# Patient Record
Sex: Female | Born: 1990 | Race: White | Hispanic: No | Marital: Single | Smoking: Former smoker
Health system: Southern US, Community
[De-identification: ages and names within clinical notes are randomized; demographics above are authoritative.]

## PROBLEM LIST (undated history)

## (undated) DIAGNOSIS — F419 Anxiety disorder, unspecified: Secondary | ICD-10-CM

## (undated) DIAGNOSIS — B019 Varicella without complication: Secondary | ICD-10-CM

## (undated) DIAGNOSIS — F909 Attention-deficit hyperactivity disorder, unspecified type: Secondary | ICD-10-CM

## (undated) HISTORY — DX: Varicella without complication: B01.9

## (undated) HISTORY — DX: Anxiety disorder, unspecified: F41.9

## (undated) HISTORY — DX: Attention-deficit hyperactivity disorder, unspecified type: F90.9

---
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
## 2011-03-22 DIAGNOSIS — F419 Anxiety disorder, unspecified: Secondary | ICD-10-CM

## 2011-03-22 HISTORY — DX: Anxiety disorder, unspecified: F41.9

## 2012-05-02 ENCOUNTER — Ambulatory Visit: Payer: 59 | Admitting: Internal Medicine

## 2012-08-07 ENCOUNTER — Ambulatory Visit: Payer: 59 | Admitting: Internal Medicine

## 2012-10-02 ENCOUNTER — Ambulatory Visit: Payer: 59 | Admitting: Internal Medicine

## 2012-11-27 ENCOUNTER — Ambulatory Visit (INDEPENDENT_AMBULATORY_CARE_PROVIDER_SITE_OTHER): Payer: 59 | Admitting: Internal Medicine

## 2012-11-27 ENCOUNTER — Encounter: Payer: Self-pay | Admitting: Internal Medicine

## 2012-11-27 VITALS — BP 108/72 | HR 79 | Temp 98.7°F | Resp 14 | Ht 65.5 in | Wt 118.2 lb

## 2012-11-27 DIAGNOSIS — D7589 Other specified diseases of blood and blood-forming organs: Secondary | ICD-10-CM

## 2012-11-27 DIAGNOSIS — Z23 Encounter for immunization: Secondary | ICD-10-CM

## 2012-11-27 DIAGNOSIS — R5381 Other malaise: Secondary | ICD-10-CM

## 2012-11-27 DIAGNOSIS — F411 Generalized anxiety disorder: Secondary | ICD-10-CM

## 2012-11-27 DIAGNOSIS — E0590 Thyrotoxicosis, unspecified without thyrotoxic crisis or storm: Secondary | ICD-10-CM

## 2012-11-27 DIAGNOSIS — Z720 Tobacco use: Secondary | ICD-10-CM

## 2012-11-27 DIAGNOSIS — Z716 Tobacco abuse counseling: Secondary | ICD-10-CM

## 2012-11-27 DIAGNOSIS — F988 Other specified behavioral and emotional disorders with onset usually occurring in childhood and adolescence: Secondary | ICD-10-CM

## 2012-11-27 DIAGNOSIS — F17200 Nicotine dependence, unspecified, uncomplicated: Secondary | ICD-10-CM

## 2012-11-27 DIAGNOSIS — Z7189 Other specified counseling: Secondary | ICD-10-CM

## 2012-11-27 DIAGNOSIS — Z3009 Encounter for other general counseling and advice on contraception: Secondary | ICD-10-CM

## 2012-11-27 MED ORDER — PROPRANOLOL HCL 10 MG PO TABS: ORAL_TABLET | ORAL | Status: DC

## 2012-11-27 MED ORDER — AMPHETAMINE-DEXTROAMPHETAMINE 20 MG PO TABS: 20.0000 mg | ORAL_TABLET | Freq: Two times a day (BID) | ORAL | Status: DC

## 2012-11-27 MED ORDER — SERTRALINE HCL 50 MG PO TABS: 50.0000 mg | ORAL_TABLET | Freq: Every day | ORAL | Status: DC

## 2012-11-27 NOTE — Progress Notes (Signed)
Patient ID: Alyssa Sharp, female   DOB: 01-10-1991, 22 y.o.   MRN: [id number]     Patient Active Problem List   Diagnosis Date Noted  . GAD (generalized anxiety disorder) 11/28/2012  . ADD (attention deficit disorder) 11/28/2012  . Other malaise and fatigue 11/28/2012    Subjective:  CC:   Chief Complaint  Patient presents with  . Establish Care    HPI:   Alyssa Sharp is a 22 y.o. female who presents as a new patient to establish primary care with the chief complaint of Increased fatigue,  uncontrolled anxiety.  She is a rising Sr. at [employer], referred by mother Alyssa Sharp for care.  Hshe has a history of GAD diagnosed in  high school , previously treated with fluoxetine, which was resumed recently .  She self discontinued over the summer but resumed it  A week or so ago for persistent symptoms.  Anxiety often triggerred by health issues of parents and graqndparents., . 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  Does not jump from one realtionship to another  Has had 3 serious relationships,  No physical abuse,  Feels like there has been some emotional "garbage" that "freaked her out" and caused her to seek psychiatric cousnelling and psychotherapy,  Reluctant to divulge details,  Now independent,  Happy on her own.  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  Does not drink to excess,  No hard liquor,  Justan occasional beer.  Since she resumed the fluoxetine she has felt really tired. 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    Smokes cigarettes , initially to help her nerves,  Now just a habit,  Not exercising regularly.. Did cheerleading in  high school.  Psychiatrist would not switch her from fluoxetine, since she tolerated it in high school.  She spent the summer in [place] studying spanish.  Did no take the fluoxetine while away but used the adderall while away.   Compulsive worrier, about grandparents,  School,  Etcl,  Some compulsive checking   ADD diagnosed by psychiatry several years ago . Has been taking adderrall.     Past Medical History  Diagnosis Date  . Chicken pox   . Anxiety 2013    Past Surgical History  Procedure Laterality Date  . Laproscopic  2008    exploratory    Family History  Problem Relation Age of Onset  . Stroke Maternal Grandfather   . Hypertension Father   . Alcohol abuse Father     recovering   . Stroke Father     History   Social History  . Marital Status: Single    Spouse Name: N/A    Number of Children: N/A  . Years of Education: N/A   Occupational History  . Not on file.   Social History Main Topics  . Smoking status: Current Every Day Smoker -- 0.50 packs/day    Types: Cigarettes  . Smokeless tobacco: Never Used  . Alcohol Use: Yes  . Drug Use: No  . Sexual Activity: Yes   Other Topics Concern  .  Not on file   Social History Narrative  . No narrative on file       @ALLHX @    Review of Systems:   The remainder of the review of systems was negative except those addressed in the HPI.       Objective:  BP 108/72  Pulse 79  Temp(Src) 98.7 F (37.1 C) (Oral)  Resp 14  Ht 5' 5.5" (1.664 m)  Wt 118 lb 4 oz (53.638 kg)  BMI 19.37 kg/m2  SpO2 98%  General appearance: alert, cooperative and appears stated age Ears: normal TM's and external ear canals both ears Throat: lips, mucosa, and tongue normal; teeth and gums normal Neck: no adenopathy, no carotid bruit, supple, symmetrical, trachea midline and thyroid not enlarged, symmetric, no tenderness/mass/nodules Back: symmetric, no curvature. ROM normal. No CVA tenderness. Lungs: clear to  auscultation bilaterally Heart: regular rate and rhythm, S1, S2 normal, no murmur, click, rub or gallop Abdomen: soft, non-tender; bowel sounds normal; no masses,  no organomegaly Pulses: 2+ and symmetric Skin: Skin color, texture, turgor normal. No rashes or lesions Lymph nodes: Cervical, supraclavicular, and axillary nodes normal.  Assessment and Plan:  GAD (generalized anxiety disorder) Switching to sertraline since she is not tolerating fluoxetine and has some obsessive type worrying and behaviors.  recommended non pharmaclolig cways to manage anxiety including regular exercise and continued psychologic counselling.   ADD (attention deficit disorder) She has requested that I take over management and refills.  3 months given,  Drug contract signed   Other malaise and fatigue Ruling out anemia, thyroid, hepatic and renal  causes  Tobacco abuse counseling Smoking cessation instruction/counseling given:  counseled patient on the dangers of tobacco use, advised patient to stop smoking, and reviewed strategies to maximize success   Updated Medication List Outpatient Encounter Prescriptions as of 11/27/2012  Medication Sig Dispense Refill  . amphetamine-dextroamphetamine (ADDERALL) 20 MG tablet Take 1 tablet (20 mg total) by mouth 2 (two) times daily.  60 tablet  0  . amphetamine-dextroamphetamine (ADDERALL) 20 MG tablet Take 1 tablet (20 mg total) by mouth 2 (two) times daily.  60 tablet  0  . propranolol (INDERAL) 10 MG tablet 1/2 tablet 30 minutes before tests as needed  30 tablet  0  . sertraline (ZOLOFT) 50 MG tablet Take 1 tablet (50 mg total) by mouth daily.  30 tablet  1  . [DISCONTINUED] amphetamine-dextroamphetamine (ADDERALL) 20 MG tablet Take 40 mg by mouth 2 (two) times daily.      . [DISCONTINUED] amphetamine-dextroamphetamine (ADDERALL) 20 MG tablet Take 1 tablet (20 mg total) by mouth 2 (two) times daily.  60 tablet  0  . [DISCONTINUED] amphetamine-dextroamphetamine  (ADDERALL) 20 MG tablet Take 1 tablet (20 mg total) by mouth 2 (two) times daily.  60 tablet  0  . [DISCONTINUED] escitalopram (LEXAPRO) 10 MG tablet Take 10 mg by mouth 2 (two) times daily.      . [DISCONTINUED] propranolol (INDERAL) 10 MG tablet 1/2 tablet 30 minutes before tests as needed  30 tablet  0   No facility-administered encounter medications on file as of 11/27/2012.

## 2012-11-27 NOTE — Patient Instructions (Signed)
I am changing your SSRI from fluoxetine to sertraline (generic zoloft)  Take 1 fluoxetine daily for one week and add 1/2 sertraline for one week   Then stop the fluxoetine and increase the sertraline to full tablet in the evening after a full meal   E mail me in a few weeks  I am giving you a low dose beta blocker to take before your tests to help manage your "stage fright"  Try it first when you are at home to make sure it doesn't make you feel tired

## 2012-11-28 ENCOUNTER — Encounter: Payer: Self-pay | Admitting: Internal Medicine

## 2012-11-28 DIAGNOSIS — F988 Other specified behavioral and emotional disorders with onset usually occurring in childhood and adolescence: Secondary | ICD-10-CM | POA: Insufficient documentation

## 2012-11-28 DIAGNOSIS — Z3009 Encounter for other general counseling and advice on contraception: Secondary | ICD-10-CM | POA: Insufficient documentation

## 2012-11-28 DIAGNOSIS — F411 Generalized anxiety disorder: Secondary | ICD-10-CM | POA: Insufficient documentation

## 2012-11-28 DIAGNOSIS — R5381 Other malaise: Secondary | ICD-10-CM | POA: Insufficient documentation

## 2012-11-28 DIAGNOSIS — Z720 Tobacco use: Secondary | ICD-10-CM | POA: Insufficient documentation

## 2012-11-28 DIAGNOSIS — Z716 Tobacco abuse counseling: Secondary | ICD-10-CM | POA: Insufficient documentation

## 2012-11-28 LAB — COMPREHENSIVE METABOLIC PANEL
ALT: 13 U/L (ref 0–35)
AST: 18 U/L (ref 0–37)
Albumin: 4.7 g/dL (ref 3.5–5.2)
BUN: 9 mg/dL (ref 6–23)
Calcium: 9.4 mg/dL (ref 8.4–10.5)
Chloride: 103 mEq/L (ref 96–112)
Potassium: 4.2 mEq/L (ref 3.5–5.1)

## 2012-11-28 LAB — CBC WITH DIFFERENTIAL/PLATELET
Basophils Relative: 0.3 % (ref 0.0–3.0)
Eosinophils Absolute: 0.1 10*3/uL (ref 0.0–0.7)
Eosinophils Relative: 0.9 % (ref 0.0–5.0)
Lymphocytes Relative: 25.6 % (ref 12.0–46.0)
Neutrophils Relative %: 65.2 % (ref 43.0–77.0)
RBC: 4.16 Mil/uL (ref 3.87–5.11)
WBC: 6.4 10*3/uL (ref 4.5–10.5)

## 2012-11-28 NOTE — Assessment & Plan Note (Addendum)
Ruling out anemia, thyroid, hepatic and renal  Causes.  TSh was suppressed and mcv was > 100.  Additional studies needed

## 2012-11-28 NOTE — Assessment & Plan Note (Signed)
Smoking cessation instruction/counseling given:  counseled patient on the dangers of tobacco use, advised patient to stop smoking, and reviewed strategies to maximize success 

## 2012-11-28 NOTE — Assessment & Plan Note (Addendum)
Switching to sertraline since she is not tolerating fluoxetine and has some obsessive type worrying and behaviors.  recommended non pharmaclolig cways to manage anxiety including regular exercise and continued psychologic counselling.

## 2012-11-28 NOTE — Assessment & Plan Note (Signed)
She has requested that I take over management and refills.  3 months given,  Drug contract signed

## 2012-12-01 NOTE — Addendum Note (Signed)
Addended by: Sherlene Shams on: 12/01/2012 07:21 AM   Modules accepted: Orders

## 2012-12-28 ENCOUNTER — Encounter: Payer: Self-pay | Admitting: *Deleted

## 2013-03-06 ENCOUNTER — Encounter: Payer: Self-pay | Admitting: Internal Medicine

## 2013-03-06 DIAGNOSIS — F411 Generalized anxiety disorder: Secondary | ICD-10-CM

## 2013-03-08 ENCOUNTER — Telehealth: Payer: Self-pay | Admitting: Emergency Medicine

## 2013-03-08 MED ORDER — BUSPIRONE HCL 10 MG PO TABS: 10.0000 mg | ORAL_TABLET | Freq: Three times a day (TID) | ORAL | Status: DC

## 2013-03-08 NOTE — Assessment & Plan Note (Signed)
Her anxiety is not relieved with zoloft trial per MyChart message.  She is requesting "instant" medication, which I advised against given her age.  Trial of buspirone and one month follow up advised.  She needs the names of the psychologists that we have compiled

## 2013-03-08 NOTE — Telephone Encounter (Signed)
Patient has already started seeing a therapist named Dr. Delford Field at her school in [place]. She is aware of the medication, she will pick up meds on Sunday. Pt will come to our office on Jan 7th at 1:45 before heading back to school on 1/12.

## 2013-03-08 NOTE — Telephone Encounter (Signed)
Her anxiety is not relieved with zoloft trial per MyChart message.  She is requesting "instant" medication, which I advised against given her age.  Trial of buspirone and one month follow up advised.  She needs the names of the psychologists that we have compiled . Amber can you e mail her with those names ?

## 2013-03-27 ENCOUNTER — Encounter: Payer: Self-pay | Admitting: Internal Medicine

## 2013-03-27 ENCOUNTER — Ambulatory Visit (INDEPENDENT_AMBULATORY_CARE_PROVIDER_SITE_OTHER): Payer: 59 | Admitting: Internal Medicine

## 2013-03-27 VITALS — BP 112/64 | HR 81 | Temp 98.1°F | Resp 12 | Wt 123.2 lb

## 2013-03-27 DIAGNOSIS — F411 Generalized anxiety disorder: Secondary | ICD-10-CM

## 2013-03-27 DIAGNOSIS — Z7189 Other specified counseling: Secondary | ICD-10-CM

## 2013-03-27 DIAGNOSIS — F17200 Nicotine dependence, unspecified, uncomplicated: Secondary | ICD-10-CM

## 2013-03-27 DIAGNOSIS — Z716 Tobacco abuse counseling: Secondary | ICD-10-CM

## 2013-03-27 MED ORDER — AMPHETAMINE-DEXTROAMPHETAMINE 20 MG PO TABS: 20.0000 mg | ORAL_TABLET | Freq: Two times a day (BID) | ORAL | Status: DC

## 2013-03-27 MED ORDER — ALPRAZOLAM 0.5 MG PO TABS: 0.5000 mg | ORAL_TABLET | Freq: Every day | ORAL | Status: DC | PRN

## 2013-03-27 NOTE — Progress Notes (Signed)
Patient ID: Alyssa Sharp, female   DOB: 01/16/1991, 23 y.o.   MRN: [id number]   Patient Active Problem List   Diagnosis Date Noted  . GAD (generalized anxiety disorder) 11/28/2012  . ADD (attention deficit disorder) 11/28/2012  . Other malaise and fatigue 11/28/2012  . Tobacco abuse 11/28/2012  . Tobacco abuse counseling 11/28/2012  . Other general counseling and advice for contraceptive management 11/28/2012    Subjective:  CC:   Chief Complaint  Patient presents with  . Follow-up    medication change    HPI:   Alyssa GrewKelsei Robertsonis a 23 y.o. female who presents for follow up on change in  Medication from  Sertraline to buspirone for persistent daily anxiety.  Since switching to buspirone 10 mg twice daily she has noticed an improvement in her daily anxiety level and has had few episodes of panic.  No side effects noted.  She started with once daily, then increased to 2 daily at 6 hour intervals.  She Had several panic attakcs while taking zoloft that prompted an request for a change in medication.  Stressors include college exams and romantic conflicts .   She admits that she has been on Christmas break since making the medication change and is guardedly optimistic the the medication change will manage the transition,    She has been taking adderall and busirone and [place] in  [place] for the last month.     Past Medical History  Diagnosis Date  . Chicken pox   . Anxiety 2013    Past Surgical History  Procedure Laterality Date  . Laproscopic  2008    exploratory       The following portions of the patient's history were reviewed and updated as appropriate: Allergies, current medications, and problem list.    Review of Systems:   12 Pt  review of systems was negative except those addressed in the HPI,     History   Social History  . Marital Status: Single    Spouse Name: N/A    Number of Children: N/A  . Years of Education: N/A    Occupational History  . Not on file.   Social History Main Topics  . Smoking status: Current Every Day Smoker -- 0.50 packs/day    Types: Cigarettes  . Smokeless tobacco: Never Used  . Alcohol Use: Yes  . Drug Use: No  . Sexual Activity: Yes   Other Topics Concern  . Not on file   Social History Narrative  . No narrative on file    Objective:  Filed Vitals:   03/27/13 1338  BP: 112/64  Pulse: 81  Temp: 98.1 F (36.7 C)  Resp: 12     General appearance: alert, cooperative and appears stated age Ears: normal TM's and external ear canals both ears Throat: lips, mucosa, and tongue normal; teeth and gums normal Neck: no adenopathy, no carotid bruit, supple, symmetrical, trachea midline and thyroid not enlarged, symmetric, no tenderness/mass/nodules Back: symmetric, no curvature. ROM normal. No CVA tenderness. Lungs: clear to auscultation bilaterally Heart: regular rate and rhythm, S1, S2 normal, no murmur, click, rub or gallop Abdomen: soft, non-tender; bowel sounds normal; no masses,  no organomegaly Pulses: 2+ and symmetric Skin: Skin color, texture, turgor normal. No rashes or lesions Lymph nodes: Cervical, supraclavicular, and axillary nodes normal.  Assessment and Plan:  GAD (generalized anxiety disorder) Improved with change from sertraline to buspirone .  rx for alprazolam to use only for panic attacks. . Addictive  nature of medication discussed.   Tobacco abuse counseling Smoking cessation instruction/counseling given:  counseled patient on the dangers of tobacco use, advised patient to stop smoking, and reviewed strategies to maximize success  A total of 25 minutes was spent with patient more than half of which was spent in counseling, reviewing records from other providers and coordination of care.   Updated Medication List Outpatient Encounter Prescriptions as of 03/27/2013  Medication Sig  . amphetamine-dextroamphetamine (ADDERALL) 20 MG tablet Take 1  tablet (20 mg total) by mouth 2 (two) times daily.  Marland Kitchen amphetamine-dextroamphetamine (ADDERALL) 20 MG tablet Take 1 tablet (20 mg total) by mouth 2 (two) times daily.  . busPIRone (BUSPAR) 10 MG tablet Take 1 tablet (10 mg total) by mouth 3 (three) times daily.  . [DISCONTINUED] amphetamine-dextroamphetamine (ADDERALL) 20 MG tablet Take 1 tablet (20 mg total) by mouth 2 (two) times daily.  . [DISCONTINUED] amphetamine-dextroamphetamine (ADDERALL) 20 MG tablet Take 1 tablet (20 mg total) by mouth 2 (two) times daily.  . [DISCONTINUED] amphetamine-dextroamphetamine (ADDERALL) 20 MG tablet Take 1 tablet (20 mg total) by mouth 2 (two) times daily.  . [DISCONTINUED] amphetamine-dextroamphetamine (ADDERALL) 20 MG tablet Take 1 tablet (20 mg total) by mouth 2 (two) times daily.  . [DISCONTINUED] amphetamine-dextroamphetamine (ADDERALL) 20 MG tablet Take 1 tablet (20 mg total) by mouth 2 (two) times daily.  . [DISCONTINUED] amphetamine-dextroamphetamine (ADDERALL) 20 MG tablet Take 1 tablet (20 mg total) by mouth 2 (two) times daily.  Marland Kitchen ALPRAZolam (XANAX) 0.5 MG tablet Take 1 tablet (0.5 mg total) by mouth daily as needed for anxiety.  . [DISCONTINUED] propranolol (INDERAL) 10 MG tablet 1/2 tablet 30 minutes before tests as needed  . [DISCONTINUED] sertraline (ZOLOFT) 50 MG tablet Take 1 tablet (50 mg total) by mouth daily.     No orders of the defined types were placed in this encounter.    No Follow-up on file.

## 2013-03-27 NOTE — Progress Notes (Signed)
Pre-visit discussion using our clinic review tool. No additional management support is needed unless otherwise documented below in the visit note.  

## 2013-03-27 NOTE — Patient Instructions (Addendum)
Continue using the buspirone two to three times daily to manage your anxiety.  We can increase the dose if needed   Save the alprazolam for panic attacks   Smoking Cessation, Tips for Success If you are ready to quit smoking, congratulations! You have chosen to help yourself be healthier. Cigarettes bring nicotine, tar, carbon monoxide, and other irritants into your body. Your lungs, heart, and blood vessels will be able to work better without these poisons. There are many different ways to quit smoking. Nicotine gum, nicotine patches, a nicotine inhaler, or nicotine nasal spray can help with physical craving. Hypnosis, support groups, and medicines help break the habit of smoking. WHAT THINGS CAN I DO TO MAKE QUITTING EASIER?  Here are some tips to help you quit for good:  Pick a date when you will quit smoking completely. Tell all of your friends and family about your plan to quit on that date.  Do not try to slowly cut down on the number of cigarettes you are smoking. Pick a quit date and quit smoking completely starting on that day.  Throw away all cigarettes.   Clean and remove all ashtrays from your home, work, and car.   On a card, write down your reasons for quitting. Carry the card with you and read it when you get the urge to smoke.   Cleanse your body of nicotine. Drink enough water and fluids to keep your urine clear or pale yellow. Do this after quitting to flush the nicotine from your body.   Learn to predict your moods. Do not let a bad situation be your excuse to have a cigarette. Some situations in your life might tempt you into wanting a cigarette.   Never have "just one" cigarette. It leads to wanting another and another. Remind yourself of your decision to quit.   Change habits associated with smoking. If you smoked while driving or when feeling stressed, try other activities to replace smoking. Stand up when drinking your coffee. Brush your teeth after eating. Sit  in a different chair when you read the paper. Avoid alcohol while trying to quit, and try to drink fewer caffeinated beverages. Alcohol and caffeine may urge you to smoke.   Avoid foods and drinks that can trigger a desire to smoke, such as sugary or spicy foods and alcohol.   Ask people who smoke not to smoke around you.   Have something planned to do right after eating or having a cup of coffee. For example, plan to take a walk or exercise.   Try a relaxation exercise to calm you down and decrease your stress. Remember, you may be tense and nervous for the first 2 weeks after you quit, but this will pass.   Find new activities to keep your hands busy. Play with a pen, coin, or rubber band. Doodle or draw things on paper.   Brush your teeth right after eating. This will help cut down on the craving for the taste of tobacco after meals. You can also try mouthwash.   Use oral substitutes in place of cigarettes. Try using lemon drops, carrots, cinnamon sticks, or chewing gum. Keep them handy so they are available when you have the urge to smoke.   When you have the urge to smoke, try deep breathing.   Designate your home as a nonsmoking area.   If you are a heavy smoker, ask your health care provider about a prescription for nicotine chewing gum. It can ease your  withdrawal from nicotine.   Reward yourself. Set aside the cigarette money you save and buy yourself something nice.   Look for support from others. Join a support group or smoking cessation program. Ask someone at home or at work to help you with your plan to quit smoking.   Always ask yourself, "Do I need this cigarette or is this just a reflex?" Tell yourself, "Today, I choose not to smoke," or "I do not want to smoke." You are reminding yourself of your decision to quit.  Do not replace cigarette smoking with electronic cigarettes (commonly called e-cigarettes). The safety of e-cigarettes is unknown, and some may  contain harmful chemicals.  If you relapse, do not give up! Plan ahead and think about what you will do the next time you get the urge to smoke.  HOW WILL I FEEL WHEN I QUIT SMOKING? You may have symptoms of withdrawal because your body is used to nicotine (the addictive substance in cigarettes). You may crave cigarettes, be irritable, feel very hungry, cough often, get headaches, or have difficulty concentrating. The withdrawal symptoms are only temporary. They are strongest when you first quit but will go away within 10 14 days. When withdrawal symptoms occur, stay in control. Think about your reasons for quitting. Remind yourself that these are signs that your body is healing and getting used to being without cigarettes. Remember that withdrawal symptoms are easier to treat than the major diseases that smoking can cause.  Even after the withdrawal is over, expect periodic urges to smoke. However, these cravings are generally short lived and will go away whether you smoke or not. Do not smoke!  WHAT RESOURCES ARE AVAILABLE TO HELP ME QUIT SMOKING? Your health care provider can direct you to community resources or hospitals for support, which may include:  Group support.  Education.  Hypnosis.  Therapy. Document Released: 12/04/2003 Document Revised: 12/26/2012 Document Reviewed: 08/23/2012 [facility] Patient Information 2014 [place].

## 2013-03-28 ENCOUNTER — Telehealth: Payer: Self-pay | Admitting: Internal Medicine

## 2013-03-28 NOTE — Telephone Encounter (Signed)
Relevant patient education assigned to patient using Emmi. ° °

## 2013-03-30 ENCOUNTER — Encounter: Payer: Self-pay | Admitting: Internal Medicine

## 2013-03-30 NOTE — Assessment & Plan Note (Signed)
Improved with change from sertraline to buspirone .  rx for alprazolam to use only for panic attacks. . Addictive nature of medication discussed.

## 2013-03-30 NOTE — Assessment & Plan Note (Signed)
Smoking cessation instruction/counseling given:  counseled patient on the dangers of tobacco use, advised patient to stop smoking, and reviewed strategies to maximize success 

## 2013-05-10 ENCOUNTER — Other Ambulatory Visit: Payer: Self-pay | Admitting: *Deleted

## 2013-05-10 DIAGNOSIS — F411 Generalized anxiety disorder: Secondary | ICD-10-CM

## 2013-05-10 MED ORDER — BUSPIRONE HCL 10 MG PO TABS: 10.0000 mg | ORAL_TABLET | Freq: Three times a day (TID) | ORAL | Status: DC

## 2013-10-24 ENCOUNTER — Telehealth: Payer: Self-pay | Admitting: *Deleted

## 2013-10-24 MED ORDER — ALPRAZOLAM 0.5 MG PO TABS: 0.5000 mg | ORAL_TABLET | Freq: Every day | ORAL | Status: DC | PRN

## 2013-10-24 NOTE — Telephone Encounter (Signed)
Faxed Rx to pharmacy. Sent mychart on need for appointment

## 2013-10-24 NOTE — Telephone Encounter (Signed)
Fax from pharmacy requesting Alprazolam 0.5mg , last OV 1.7.15.  Please advise refill.

## 2013-10-24 NOTE — Telephone Encounter (Signed)
Refill one 30 days only.  Has not been seen in over 6 months so needs office visit prior to any more refills 

## 2013-11-01 ENCOUNTER — Encounter: Payer: Self-pay | Admitting: Adult Health

## 2013-11-01 ENCOUNTER — Ambulatory Visit (INDEPENDENT_AMBULATORY_CARE_PROVIDER_SITE_OTHER): Payer: 59 | Admitting: Adult Health

## 2013-11-01 VITALS — BP 115/77 | HR 100 | Temp 98.8°F | Resp 14 | Ht 65.5 in | Wt 123.5 lb

## 2013-11-01 DIAGNOSIS — H9193 Unspecified hearing loss, bilateral: Secondary | ICD-10-CM

## 2013-11-01 DIAGNOSIS — H9190 Unspecified hearing loss, unspecified ear: Secondary | ICD-10-CM

## 2013-11-01 NOTE — Patient Instructions (Signed)
  I am referring you to ENT for your decreased hearing.  Lower the volume on your music :-)  We will contact you with an appointment.

## 2013-11-01 NOTE — Progress Notes (Signed)
Pre visit review using our clinic review tool, if applicable. No additional management support is needed unless otherwise documented below in the visit note. 

## 2013-11-01 NOTE — Progress Notes (Signed)
Patient ID: Alyssa MansKelsei Sharp, female   DOB: [birth year], 23 y.o.   MRN: [id number]   Subjective:    Patient ID: Alyssa Sharp, female    DOB: [birth year], 23 y.o.   MRN: [id number]  HPI  Pt is a pleasant 23 y/o female who presents to clinic with concerns of hearing loss. She reports that she has had cerumen build up in the past and having them cleaned out alleviated her symptoms. She does not have any other symptoms. No recent URI. Pt listens to music at a high volume.  Past Medical History  Diagnosis Date  . Chicken pox   . Anxiety 2013    Current Outpatient Prescriptions on File Prior to Visit  Medication Sig Dispense Refill  . ALPRAZolam (XANAX) 0.5 MG tablet Take 1 tablet (0.5 mg total) by mouth daily as needed for anxiety.  30 tablet  0  . amphetamine-dextroamphetamine (ADDERALL) 20 MG tablet Take 1 tablet (20 mg total) by mouth 2 (two) times daily.  60 tablet  0   No current facility-administered medications on file prior to visit.     Review of Systems  Constitutional: Negative for fever.  HENT: Positive for hearing loss. Negative for congestion, ear discharge, ear pain, postnasal drip and sinus pressure.   Neurological: Negative for dizziness and speech difficulty.       Objective:  BP 115/77  Pulse 100  Temp(Src) 98.8 F (37.1 C) (Oral)  Resp 14  Ht 5' 5.5" (1.664 m)  Wt 123 lb 8 oz (56.019 kg)  BMI 20.23 kg/m2  SpO2 96%   Physical Exam  Constitutional: She is oriented to person, place, and time. No distress.  HENT:  Right Ear: External ear normal.  Left Ear: External ear normal.  TM are gray, translucent. No cerumen build up on either ear.  Cardiovascular: Normal rate and regular rhythm.   Pulmonary/Chest: Effort normal. No respiratory distress.  Musculoskeletal: Normal range of motion.  Neurological: She is alert and oriented to person, place, and time.  Skin: Skin is warm and dry.  Psychiatric: She has a normal mood and affect. Her behavior is normal.  Judgment and thought content normal.      Assessment & Plan:   1. Hearing loss, bilateral No cerumen buildup, no respiratory infection or congestion, TM are intact. I suspect that she may have hearing loss from listening to loud music. I am referring her to ENT for further evaluation of her hearing loss. - Ambulatory referral to ENT

## 2013-11-04 ENCOUNTER — Telehealth: Payer: Self-pay | Admitting: Internal Medicine

## 2013-11-04 NOTE — Telephone Encounter (Signed)
Relevant patient education mailed to patient.  

## 2013-12-13 ENCOUNTER — Ambulatory Visit (INDEPENDENT_AMBULATORY_CARE_PROVIDER_SITE_OTHER): Payer: 59 | Admitting: Internal Medicine

## 2013-12-13 DIAGNOSIS — Z0000 Encounter for general adult medical examination without abnormal findings: Secondary | ICD-10-CM

## 2013-12-15 DIAGNOSIS — Z0000 Encounter for general adult medical examination without abnormal findings: Secondary | ICD-10-CM | POA: Insufficient documentation

## 2013-12-15 NOTE — Progress Notes (Signed)
Patient ID: Alyssa Sharp, female   DOB: 03/27/1990, 23 y.o.   MRN: [id number] Patient no showed for exam.

## 2013-12-15 NOTE — Assessment & Plan Note (Signed)
Patient no showed for annual exam.

## 2014-01-13 ENCOUNTER — Telehealth: Payer: Self-pay | Admitting: Internal Medicine

## 2014-01-13 NOTE — Telephone Encounter (Signed)
ALPRAZolam (XANAX) 0.5 MG tablet  amphetamine-dextroamphetamine (ADDERALL) 20 MG tablet

## 2014-01-13 NOTE — Telephone Encounter (Signed)
Denied,  She no showed for her annual physical,  And did not return for the follow up labs that were ordered in 2014!!! Must be seen

## 2014-01-13 NOTE — Telephone Encounter (Signed)
Please advised refills

## 2014-01-13 NOTE — Telephone Encounter (Signed)
Notified of MDs message

## 2014-02-03 ENCOUNTER — Ambulatory Visit (INDEPENDENT_AMBULATORY_CARE_PROVIDER_SITE_OTHER): Payer: 59 | Admitting: Internal Medicine

## 2014-02-03 ENCOUNTER — Encounter: Payer: Self-pay | Admitting: Internal Medicine

## 2014-02-03 VITALS — BP 124/72 | HR 96 | Temp 98.6°F | Resp 16 | Ht 65.5 in | Wt 140.0 lb

## 2014-02-03 DIAGNOSIS — Z23 Encounter for immunization: Secondary | ICD-10-CM

## 2014-02-03 DIAGNOSIS — Z113 Encounter for screening for infections with a predominantly sexual mode of transmission: Secondary | ICD-10-CM

## 2014-02-03 DIAGNOSIS — F909 Attention-deficit hyperactivity disorder, unspecified type: Secondary | ICD-10-CM

## 2014-02-03 DIAGNOSIS — F329 Major depressive disorder, single episode, unspecified: Secondary | ICD-10-CM

## 2014-02-03 DIAGNOSIS — F411 Generalized anxiety disorder: Secondary | ICD-10-CM

## 2014-02-03 DIAGNOSIS — F988 Other specified behavioral and emotional disorders with onset usually occurring in childhood and adolescence: Secondary | ICD-10-CM

## 2014-02-03 DIAGNOSIS — R5383 Other fatigue: Secondary | ICD-10-CM

## 2014-02-03 LAB — CBC WITH DIFFERENTIAL/PLATELET
Basophils Absolute: 0 10*3/uL (ref 0.0–0.1)
Basophils Relative: 0.3 % (ref 0.0–3.0)
EOS ABS: 0.1 10*3/uL (ref 0.0–0.7)
Eosinophils Relative: 1.6 % (ref 0.0–5.0)
HCT: 44.5 % (ref 36.0–46.0)
Hemoglobin: 14.6 g/dL (ref 12.0–15.0)
LYMPHS PCT: 32 % (ref 12.0–46.0)
Lymphs Abs: 2.4 10*3/uL (ref 0.7–4.0)
MCHC: 32.9 g/dL (ref 30.0–36.0)
MCV: 100.2 fl — AB (ref 78.0–100.0)
MONO ABS: 0.7 10*3/uL (ref 0.1–1.0)
Monocytes Relative: 8.7 % (ref 3.0–12.0)
Neutro Abs: 4.3 10*3/uL (ref 1.4–7.7)
Neutrophils Relative %: 57.4 % (ref 43.0–77.0)
PLATELETS: 296 10*3/uL (ref 150.0–400.0)
RBC: 4.44 Mil/uL (ref 3.87–5.11)
RDW: 12.5 % (ref 11.5–15.5)
WBC: 7.5 10*3/uL (ref 4.0–10.5)

## 2014-02-03 MED ORDER — ESCITALOPRAM OXALATE 20 MG PO TABS: 20.0000 mg | ORAL_TABLET | Freq: Every day | ORAL | Status: DC

## 2014-02-03 MED ORDER — AMPHETAMINE-DEXTROAMPHETAMINE 20 MG PO TABS: 20.0000 mg | ORAL_TABLET | Freq: Two times a day (BID) | ORAL | Status: DC

## 2014-02-03 MED ORDER — ALPRAZOLAM 0.5 MG PO TABS: 0.5000 mg | ORAL_TABLET | Freq: Two times a day (BID) | ORAL | Status: DC | PRN
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
## 2014-02-03 NOTE — Patient Instructions (Signed)
I agree with resuming lexapro starting with 1/2 tablet for the first few days  Continue alprazolam up to 2 times daily as needed for the first few weeks   Try to wean yourself down to once daily on the alprazolam thereafter , OR TRY REDUCING DOSE TO `1/2 ALPRAZOLAM TWICE DAILY INITIALLY

## 2014-02-03 NOTE — Progress Notes (Signed)
Pre-visit discussion using our clinic review tool. No additional management support is needed unless otherwise documented below in the visit note.  

## 2014-02-04 LAB — COMPREHENSIVE METABOLIC PANEL
ALBUMIN: 5 g/dL (ref 3.5–5.2)
ALT: 16 U/L (ref 0–35)
AST: 18 U/L (ref 0–37)
Alkaline Phosphatase: 51 U/L (ref 39–117)
BUN: 9 mg/dL (ref 6–23)
CO2: 22 mEq/L (ref 19–32)
Calcium: 9.6 mg/dL (ref 8.4–10.5)
Chloride: 106 mEq/L (ref 96–112)
Creatinine, Ser: 0.6 mg/dL (ref 0.4–1.2)
GFR: 134.64 mL/min (ref >60.00)
Glucose, Bld: 86 mg/dL (ref 70–99)
POTASSIUM: 4.2 meq/L (ref 3.5–5.1)
Sodium: 138 mEq/L (ref 135–145)
Total Bilirubin: 0.6 mg/dL (ref 0.2–1.2)
Total Protein: 7.8 g/dL (ref 6.0–8.3)

## 2014-02-04 LAB — HEPATITIS C ANTIBODY: HCV AB: NEGATIVE

## 2014-02-04 LAB — TSH: TSH: 1.08 u[IU]/mL (ref 0.35–4.50)

## 2014-02-04 LAB — HIV ANTIBODY (ROUTINE TESTING W REFLEX): HIV 1&2 Ab, 4th Generation: NONREACTIVE

## 2014-02-04 NOTE — Assessment & Plan Note (Signed)
We discussed resuming lexapro for anxiety , as the other trials of sertraline and buspirone were not effective. She has been using alprazolam twice daily .  I have advised to begin reducing dosing to once daily once she has been taking the lexapro for several weeks.

## 2014-02-04 NOTE — Assessment & Plan Note (Signed)
She is tolerating her current dose of Adderall and has requested  refills.  3 months given,

## 2014-02-06 ENCOUNTER — Encounter: Payer: Self-pay | Admitting: Internal Medicine

## 2014-04-21 ENCOUNTER — Other Ambulatory Visit: Payer: Self-pay | Admitting: Internal Medicine

## 2014-04-21 NOTE — Telephone Encounter (Signed)
Ok to refill,  printed rx  

## 2014-04-21 NOTE — Telephone Encounter (Signed)
Last refill 11.17.15, last OV 11.16.15.  Please advise refill

## 2014-04-22 NOTE — Telephone Encounter (Signed)
rx faxed

## 2014-06-24 ENCOUNTER — Other Ambulatory Visit: Payer: Self-pay | Admitting: Internal Medicine

## 2014-06-24 NOTE — Telephone Encounter (Signed)
Pt needs refill on Adderall and Xanax. Please advise pt when ready/msn

## 2014-06-24 NOTE — Telephone Encounter (Signed)
Last visit 02/03/14, ok refill? 

## 2014-06-25 MED ORDER — ALPRAZOLAM 0.5 MG PO TABS: ORAL_TABLET | ORAL | Status: DC

## 2014-06-25 MED ORDER — AMPHETAMINE-DEXTROAMPHETAMINE 20 MG PO TABS: 20.0000 mg | ORAL_TABLET | Freq: Two times a day (BID) | ORAL | Status: DC

## 2014-06-25 NOTE — Telephone Encounter (Signed)
Refill one 30 days only.  Needs 6 month follow up visit prior to any more refills

## 2014-06-25 NOTE — Telephone Encounter (Signed)
Spoke with pt advised Rx ready for pick up, also advised appointment needed.  Pt verbalized understanding

## 2014-06-25 NOTE — Telephone Encounter (Signed)
Pt aware Rx ready for pick up and pt scheduled appoint.

## 2014-07-14 ENCOUNTER — Ambulatory Visit (INDEPENDENT_AMBULATORY_CARE_PROVIDER_SITE_OTHER): Payer: Self-pay | Admitting: Internal Medicine

## 2014-07-14 DIAGNOSIS — F909 Attention-deficit hyperactivity disorder, unspecified type: Secondary | ICD-10-CM

## 2014-07-15 ENCOUNTER — Encounter: Payer: Self-pay | Admitting: *Deleted

## 2014-07-15 NOTE — Progress Notes (Signed)
Patient ID: [name], female   DOB: 1990/04/26, 24 y.o.   MRN: [id number]   Patient did not show for scheduled 30 minute appointment.  Given her history of recurrent cancellations,  I have elected to terminate my services as her physician with a 30 day notice.

## 2014-07-15 NOTE — Assessment & Plan Note (Signed)
patient no showed.

## 2014-07-16 ENCOUNTER — Telehealth: Payer: Self-pay

## 2014-07-16 NOTE — Telephone Encounter (Signed)
Patient dismissal process started with HIM.  

## 2014-07-17 ENCOUNTER — Telehealth: Payer: Self-pay | Admitting: Internal Medicine

## 2014-07-17 NOTE — Telephone Encounter (Signed)
Patient dismissed from [facility] by Duncan Dulleresa Tullo MD , effective July 15, 2014. Dismissal letter sent out by certified / registered mail.  DAJ  Received signed domestic return receipt verifying delivery of certified letter on May 3,2016. Article number [id number] [phone] DAJ

## 2014-07-22 ENCOUNTER — Ambulatory Visit (INDEPENDENT_AMBULATORY_CARE_PROVIDER_SITE_OTHER): Payer: 59 | Admitting: Internal Medicine

## 2014-07-22 ENCOUNTER — Encounter: Payer: Self-pay | Admitting: Internal Medicine

## 2014-07-22 VITALS — BP 116/72 | HR 78 | Temp 99.1°F | Resp 14 | Ht 66.0 in | Wt 144.0 lb

## 2014-07-22 DIAGNOSIS — F411 Generalized anxiety disorder: Secondary | ICD-10-CM | POA: Diagnosis not present

## 2014-07-22 DIAGNOSIS — F988 Other specified behavioral and emotional disorders with onset usually occurring in childhood and adolescence: Secondary | ICD-10-CM

## 2014-07-22 DIAGNOSIS — F909 Attention-deficit hyperactivity disorder, unspecified type: Secondary | ICD-10-CM

## 2014-07-22 MED ORDER — AMPHETAMINE-DEXTROAMPHETAMINE 20 MG PO TABS: 20.0000 mg | ORAL_TABLET | Freq: Two times a day (BID) | ORAL | Status: DC

## 2014-07-22 MED ORDER — ALPRAZOLAM 0.5 MG PO TABS: 0.5000 mg | ORAL_TABLET | Freq: Every day | ORAL | Status: DC | PRN

## 2014-07-22 NOTE — Progress Notes (Signed)
Patient ID: Alyssa Sharp, female   DOB: 1990-04-02, 24 y.o.   MRN: [id number]   Patient Active Problem List   Diagnosis Date Noted  . Tobacco abuse 11/28/2012    Priority: Medium  . Visit for preventive health examination 12/15/2013  . GAD (generalized anxiety disorder) 11/28/2012  . ADD (attention deficit disorder) 11/28/2012  . Other malaise and fatigue 11/28/2012  . Tobacco abuse counseling 11/28/2012  . Other general counseling and advice for contraceptive management 11/28/2012    Subjective:  CC:   Chief Complaint  Patient presents with  . Follow-up    Medication refills  on adderall    HPI:   Alyssa Sharp is a 24 y.o. female who presents for  Here for medication refill on adderall.  patient has recently been issued a termination letter due to her  recurrent cancellations and no shows.  S   Noticed that the adderall and lexapro regimen  made her feel tired, , but the adderall and alprazolam combination helped her  To maintain alertness and also alleviated some of the tension and spasm caused by her left sided TMJ issues.   She is using the adderall 4 days per week Monday through Thursday,  And Using the alprazolam before tests  To calm her down as needed,  Not daily,  Not cominbing with alcohol.  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 Chicken pox   . Anxiety 2013    Past Surgical History  Procedure Laterality Date  . Laproscopic  2008    exploratory       The following portions of the patient's history were reviewed and updated as appropriate: Allergies, current medications, and problem list.    Review of Systems:   Patient denies headache, fevers, malaise, unintentional weight loss, skin rash, eye pain, sinus congestion and sinus pain, sore throat, dysphagia,  hemoptysis , cough, dyspnea, wheezing, chest pain, palpitations, orthopnea, edema, abdominal pain, nausea,  melena, diarrhea, constipation, flank pain, dysuria, hematuria, urinary  Frequency, nocturia, numbness, tingling, seizures,  Focal weakness, Loss of consciousness,  Tremor, insomnia, depression, anxiety, and suicidal ideation.     History   Social History  . Marital Status: Single    Spouse Name: N/A  . Number of Children: N/A  . Years of Education: N/A   Occupational History  . Not on file.   Social History Main Topics  . Smoking status: Current Every Day Smoker -- 0.50 packs/day    Types: Cigarettes  . Smokeless tobacco: Never Used  . Alcohol Use: Yes  . Drug Use: No  . Sexual Activity: Yes   Other Topics Concern  . Not on file   Social History Narrative    Objective:  Filed Vitals:   07/22/14 0856  BP: 116/72  Pulse: 78  Temp: 99.1 F (37.3 C)  Resp: 14     General appearance: alert, cooperative and appears stated age Ears: normal TM's and external ear canals both ears Lungs: clear to auscultation bilaterally Heart: regular rate and rhythm, S1, S2 normal, no murmur, click, rub or gallop Abdomen: soft, non-tender; bowel sounds normal; no masses,  no organomegaly Pulses: 2+ and symmetric Skin: Skin color, texture, turgor normal. No rashes or lesions Lymph nodes: Cervical, supraclavicular, and axillary nodes normal. Psych: affect normal, makes good eye contact. No fidgeting,  Smiles easily.  Denies suicidal thoughts   Assessment and Plan:  GAD (generalized anxiety  disorder) She did not tolerate trial of lexapro due to excessive lethargy.  She isa using alprazolam prn before tests.  Rx given  For #30 with 2 refills.  There will be no additional refills from this office.    ADD (attention deficit disorder) patient no showed for her 6 month follow up last week and her records has resulted in termination .  She is here today to request refills until she can find another physicianb, .  She is tolerating her current dose of Adderall and has recevied  Rx for 3  months.  No additional refills will be given from this office.,        Updated Medication List Outpatient Encounter Prescriptions as of 07/22/2014  Medication Sig  . ALPRAZolam (XANAX) 0.5 MG tablet Take 1 tablet (0.5 mg total) by mouth daily as needed.  Marland Kitchen. amphetamine-dextroamphetamine (ADDERALL) 20 MG tablet Take 1 tablet (20 mg total) by mouth 2 (two) times daily.  Marland Kitchen. amphetamine-dextroamphetamine (ADDERALL) 20 MG tablet Take 1 tablet (20 mg total) by mouth 2 (two) times daily.  . [DISCONTINUED] ALPRAZolam (XANAX) 0.5 MG tablet TAKE ONE TABLET BY MOUTH TWICE A DAY AS NEEDED FOR ANXIETY (Patient taking differently: Take 0.5 mg by mouth daily as needed. )  . [DISCONTINUED] ALPRAZolam (XANAX) 0.5 MG tablet Take 1 tablet (0.5 mg total) by mouth daily as needed.  . [DISCONTINUED] amphetamine-dextroamphetamine (ADDERALL) 20 MG tablet Take 1 tablet (20 mg total) by mouth 2 (two) times daily.  . [DISCONTINUED] amphetamine-dextroamphetamine (ADDERALL) 20 MG tablet Take 1 tablet (20 mg total) by mouth 2 (two) times daily.  . [DISCONTINUED] amphetamine-dextroamphetamine (ADDERALL) 20 MG tablet Take 1 tablet (20 mg total) by mouth 2 (two) times daily.  . [DISCONTINUED] amphetamine-dextroamphetamine (ADDERALL) 20 MG tablet Take 1 tablet (20 mg total) by mouth 2 (two) times daily.  . [DISCONTINUED] escitalopram (LEXAPRO) 20 MG tablet Take 1 tablet (20 mg total) by mouth daily.   No facility-administered encounter medications on file as of 07/22/2014.     No orders of the defined types were placed in this encounter.    No Follow-up on file.

## 2014-07-22 NOTE — Progress Notes (Signed)
Pre-visit discussion using our clinic review tool. No additional management support is needed unless otherwise documented below in the visit note.  

## 2014-07-22 NOTE — Patient Instructions (Addendum)
I have refilled your adderall for 3 months to give you time to find another physician  I have refilled your alprazolam as well.  DO NOT COMIBNE WITH ALCOHOL OR SHARE WITH OTHERS

## 2014-07-24 ENCOUNTER — Encounter: Payer: Self-pay | Admitting: Internal Medicine

## 2014-07-24 NOTE — Assessment & Plan Note (Signed)
patient no showed for her 6 month follow up last week and her records has resulted in termination .  She is here today to request refills until she can find another physicianb, .  She is tolerating her current dose of Adderall and has recevied  Rx for 3 months.  No additional refills will be given from this office.,

## 2014-07-24 NOTE — Assessment & Plan Note (Addendum)
She did not tolerate trial of lexapro due to excessive lethargy.  She isa using alprazolam prn before tests.  Rx given  For #30 with 2 refills.  There will be no additional refills from this office.

## 2014-10-13 ENCOUNTER — Other Ambulatory Visit: Payer: Self-pay

## 2014-10-13 ENCOUNTER — Ambulatory Visit (INDEPENDENT_AMBULATORY_CARE_PROVIDER_SITE_OTHER): Payer: 59 | Admitting: Obstetrics and Gynecology

## 2014-10-13 VITALS — BP 113/70 | HR 72 | Ht 65.6 in | Wt 148.4 lb

## 2014-10-13 DIAGNOSIS — Z3042 Encounter for surveillance of injectable contraceptive: Secondary | ICD-10-CM

## 2014-10-13 MED ORDER — MEDROXYPROGESTERONE ACETATE 150 MG/ML IM SUSP: 150.0000 mg | INTRAMUSCULAR | Status: DC

## 2014-10-13 MED ORDER — MEDROXYPROGESTERONE ACETATE 150 MG/ML IM SUSP
150.0000 mg | Freq: Once | INTRAMUSCULAR | Status: AC
  Administered 2014-10-13: 150 mg via INTRAMUSCULAR

## 2014-10-13 NOTE — Progress Notes (Cosign Needed)
Pt scheduled annual visit for 10/27/14.  Does have some questions about Depo-Provera such as: will this hurt her long term and is having weight gain since she got out of college. Will speak to MNB on annual visit.

## 2014-10-29 ENCOUNTER — Ambulatory Visit (INDEPENDENT_AMBULATORY_CARE_PROVIDER_SITE_OTHER): Payer: 59 | Admitting: Obstetrics and Gynecology

## 2014-10-29 ENCOUNTER — Other Ambulatory Visit: Payer: Self-pay | Admitting: Obstetrics and Gynecology

## 2014-10-29 ENCOUNTER — Encounter: Payer: Self-pay | Admitting: Obstetrics and Gynecology

## 2014-10-29 VITALS — BP 119/82 | HR 97 | Ht 66.0 in | Wt 146.8 lb

## 2014-10-29 DIAGNOSIS — Z01419 Encounter for gynecological examination (general) (routine) without abnormal findings: Secondary | ICD-10-CM | POA: Diagnosis not present

## 2014-10-29 MED ORDER — LEVONORGEST-ETH ESTRAD 91-DAY 0.1-0.02 & 0.01 MG PO TABS: 1.0000 | ORAL_TABLET | Freq: Every day | ORAL | Status: DC

## 2014-10-29 NOTE — Patient Instructions (Addendum)
Thank you for enrolling in MyChart. Please follow the instructions below to securely access your online medical record. MyChart allows you to send messages to your doctor, view your test results, renew your prescriptions, schedule appointments, and more.  How Do I Sign Up? 1. In your Internet browser, go to [email or web address]. 2. Click on the New  User? link in the Sign In box.  3. Enter your MyChart Access Code exactly as it appears below. You will not need to use this code after you have completed the sign-up process. If you do not sign up before the expiration date, you must request a new code. MyChart Access Code: Activation code not generated Current MyChart Status: Active  4. Enter the last four digits of your Social Security Number (xxxx) and Date of Birth (mm/dd/yyyy) as indicated and click Next. You will be taken to the next sign-up page. 5. Create a MyChart ID. This will be your MyChart login ID and cannot be changed, so think of one that is secure and easy to remember. 6. Create a MyChart password. You can change your password at any time. 7. Enter your Password Reset Question and Answer and click Next. This can be used at a later time if you forget your password.  8. Select your communication preference, and if applicable enter your e-mail address. You will receive e-mail notification when new information is available in MyChart by choosing to receive e-mail notifications and filling in your e-mail. 9. Click Sign In. You can now view your medical record.   Additional Information If you have questions, you can email [email or web address] or call [phone] to talk to our MyChart staff. Remember, MyChart is NOT to be used for urgent needs. For medical emergencies, dial 911.  Place annual gynecologic exam patient instructions here.  

## 2014-10-29 NOTE — Progress Notes (Signed)
  Subjective:     Alyssa Sharp is a 24 y.o. female and is here for a comprehensive physical exam. The patient reports no problems.  Social History   Social History  . Marital Status: Single    Spouse Name: N/A  . Number of Children: N/A  . Years of Education: N/A   Occupational History  . Not on file.   Social History Main Topics  . Smoking status: Current Every Day Smoker -- 0.50 packs/day    Types: Cigarettes  . Smokeless tobacco: Never Used  . Alcohol Use: Yes  . Drug Use: No  . Sexual Activity: Yes    Birth Control/ Protection: Injection   Other Topics Concern  . Not on file   Social History Narrative   Health Maintenance  Topic Date Due  . PAP SMEAR  06/01/2009  . TETANUS/TDAP  06/02/2010  . INFLUENZA VACCINE  10/20/2014  . HIV Screening  Completed    The following portions of the patient's history were reviewed and updated as appropriate: allergies, current medications, past family history, past medical history, past social history, past surgical history and problem list.  Review of Systems A comprehensive review of systems was negative.   Objective:    General appearance: alert, cooperative and appears stated age Neck: no adenopathy, no carotid bruit, no JVD, supple, symmetrical, trachea midline and thyroid not enlarged, symmetric, no tenderness/mass/nodules Lungs: clear to auscultation bilaterally Breasts: normal appearance, no masses or tenderness Heart: regular rate and rhythm, S1, S2 normal, no murmur, click, rub or gallop Abdomen: soft, non-tender; bowel sounds normal; no masses,  no organomegaly Pelvic: cervix normal in appearance, external genitalia normal, no adnexal masses or tenderness, no cervical motion tenderness, rectovaginal septum normal, uterus normal size, shape, and consistency and vagina normal without discharge    Assessment:    Healthy female exam. 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 RTC 1 year or prn   See After Visit Summary for Counseling Recommendations

## 2014-10-31 LAB — CYTOLOGY - PAP

## 2014-11-03 ENCOUNTER — Encounter: Payer: Self-pay | Admitting: *Deleted

## 2015-01-07 DIAGNOSIS — F419 Anxiety disorder, unspecified: Secondary | ICD-10-CM | POA: Insufficient documentation

## 2015-01-27 ENCOUNTER — Ambulatory Visit (INDEPENDENT_AMBULATORY_CARE_PROVIDER_SITE_OTHER): Payer: 59 | Admitting: Licensed Clinical Social Worker

## 2015-01-27 DIAGNOSIS — F411 Generalized anxiety disorder: Secondary | ICD-10-CM | POA: Diagnosis not present
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
## 2015-02-16 ENCOUNTER — Encounter: Payer: Self-pay | Admitting: Psychiatry

## 2015-02-16 ENCOUNTER — Ambulatory Visit (INDEPENDENT_AMBULATORY_CARE_PROVIDER_SITE_OTHER): Payer: 59 | Admitting: Psychiatry

## 2015-02-16 ENCOUNTER — Ambulatory Visit (INDEPENDENT_AMBULATORY_CARE_PROVIDER_SITE_OTHER): Payer: 59 | Admitting: Licensed Clinical Social Worker

## 2015-02-16 VITALS — BP 140/72 | HR 95 | Temp 99.1°F | Ht 66.0 in | Wt 158.2 lb

## 2015-02-16 DIAGNOSIS — F411 Generalized anxiety disorder: Secondary | ICD-10-CM

## 2015-02-16 DIAGNOSIS — F331 Major depressive disorder, recurrent, moderate: Secondary | ICD-10-CM

## 2015-02-16 MED ORDER — LAMOTRIGINE 25 MG PO TABS
25.0000 mg | ORAL_TABLET | Freq: Every day | ORAL | Status: DC
Start: 2015-02-16 — End: 2015-04-28
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
## 2015-02-18 NOTE — Progress Notes (Signed)
   THERAPIST PROGRESS NOTE  Session Time: 60min  Participation Level: Active  Behavioral Response: CasualAlertAnxious  Type of Therapy: Individual Therapy  Treatment Goals addressed: Coping  Interventions: CBT, Motivational Interviewing, Solution Focused, Strength-based, Supportive, Family Systems and Reframing  Summary: Alyssa Sharp is a 24 y.o. female who presents with continued symptoms of her diagnosis.  She was present with her mother to assist with discussing her relationship with her sister and to further understand her anxiety.  Alyssa GrewKelsei wants to build a relationship with her older sister but feels like she is not good enough.  "She made me lose weight to visit her in [place] Alyssa Sharp studies and is concerned about school.  She wants to become a behavioral health nurse or a doctor.  She is taking medications as prescribed and reports no side effects.  She has utilized one Associate Professorcoping skill taught at intake.   Suicidal/Homicidal: Nowithout intent/plan  Therapist Response: LCSW provided Patient with ongoing emotional support and encouragement.  Normalized her feelings.  Commended Patient on her progress and reinforced the importance of client staying focused on her own strengths and resources and resiliency. Processed various strategies for dealing with stressors.    Plan: Return again in 2 weeks.  Diagnosis: Axis I: Generalized Anxiety Disorder    Axis II: No diagnosis    Marinda Elkicole M Eadie Repetto 02/18/2015

## 2015-03-18 ENCOUNTER — Ambulatory Visit (INDEPENDENT_AMBULATORY_CARE_PROVIDER_SITE_OTHER): Payer: 59 | Admitting: Licensed Clinical Social Worker

## 2015-03-18 DIAGNOSIS — F411 Generalized anxiety disorder: Secondary | ICD-10-CM | POA: Diagnosis not present

## 2015-03-26 ENCOUNTER — Ambulatory Visit (INDEPENDENT_AMBULATORY_CARE_PROVIDER_SITE_OTHER): Payer: 59 | Admitting: Psychiatry

## 2015-03-26 DIAGNOSIS — F411 Generalized anxiety disorder: Secondary | ICD-10-CM | POA: Diagnosis not present

## 2015-03-26 DIAGNOSIS — F331 Major depressive disorder, recurrent, moderate: Secondary | ICD-10-CM | POA: Diagnosis not present

## 2015-03-26 MED ORDER — LAMOTRIGINE 25 MG PO TABS: 50.0000 mg | ORAL_TABLET | Freq: Every day | ORAL | Status: DC

## 2015-03-26 MED ORDER — PAROXETINE HCL 20 MG PO TABS: 20.0000 mg | ORAL_TABLET | Freq: Every day | ORAL | Status: DC

## 2015-03-26 MED ORDER — AMPHETAMINE-DEXTROAMPHETAMINE 10 MG PO TABS: 10.0000 mg | ORAL_TABLET | Freq: Two times a day (BID) | ORAL | Status: DC

## 2015-03-26 NOTE — Progress Notes (Signed)
   THERAPIST PROGRESS NOTE  Session Time: 60min  Participation Level: Active  Behavioral Response: Casual and NeatAlertAnxious  Type of Therapy: Individual Therapy  Treatment Goals addressed: Coping  Interventions: CBT, Motivational Interviewing, Solution Focused, Supportive, Family Systems and Reframing  Summary: Alyssa Sharp is a 25 y.o. female who presents with symptoms of her diagnosis.  She continues to struggle with her relationship with her older sister.  She and her sister argue through text messages.  Alyssa Sharp reports that [place] leave in February 2017 to acclimate herself to the city.  Explored coping skills and transferring to a doctor in that area.  She is currently wanting to continue with Dr. Garnetta BuddyFaheem and return her every 3 months for her medication. She stresses out about making good grades and completing the program while discussing the program.  She was able to demonstrate effective breathing techniques previously taught.  Suicidal/Homicidal: Nowithout intent/plan  Therapist Response: LCSW provided Patient with ongoing emotional support and encouragement.  Normalized her feelings.  Commended Patient on her progress and reinforced the importance of client staying focused on her own strengths and resources and resiliency. Processed various strategies for dealing with stressors.    Plan: Return again in 2 weeks.  Diagnosis: Axis I: Generalized Anxiety Disorder    Axis II: No diagnosis    Alyssa Sharp 03/26/2015
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
## 2015-04-15 ENCOUNTER — Ambulatory Visit (INDEPENDENT_AMBULATORY_CARE_PROVIDER_SITE_OTHER): Payer: 59 | Admitting: Licensed Clinical Social Worker

## 2015-04-15 DIAGNOSIS — F411 Generalized anxiety disorder: Secondary | ICD-10-CM | POA: Diagnosis not present

## 2015-04-22 NOTE — Progress Notes (Signed)
   [facility] PHP THERAPIST PROGRESS NOTE  [name] [phone]  Session Time:  Participation Level: Active  Behavioral Response: CasualAlertAnxious  Type of Therapy: Individual Therapy  Treatment Goals addressed: Coping  Interventions: CBT, Motivational Interviewing, Solution Focused, Strength-based, Supportive, Family Systems and Reframing  Summary: Alyssa Sharp is a 25 y.o. female who presents with continued symptoms of her diagnosis.  She explored her current stressors and vented her frustration regarding her upcoming move.  Patient is slightly anxious about her move to [place] to pursue her education.  Patient explored her options with housing, Psychiatry, part time work and studying. Patient has a positive outlook on her future and expresses that she is able to handle adversity.   Suicidal/Homicidal: Nowithout intent/plan  Therapist Response: LCSW provided Patient with ongoing emotional support and encouragement.  Normalized her feelings.  Commended Patient on her progress and reinforced the importance of client staying focused on her own strengths and resources and resiliency. Processed various strategies for dealing with stressors.    Plan: Continue with therapy and medication management.  Diagnosis: Primary Diagnosis: Generalized anxiety disorder [F41.1]    1. Generalized anxiety disorder       Marinda Elk 04/15/2015

## 2015-04-27 ENCOUNTER — Other Ambulatory Visit: Payer: Self-pay | Admitting: *Deleted

## 2015-04-27 ENCOUNTER — Telehealth: Payer: Self-pay | Admitting: Obstetrics and Gynecology

## 2015-04-27 MED ORDER — LEVONORGEST-ETH ESTRAD 91-DAY 0.1-0.02 & 0.01 MG PO TABS: 1.0000 | ORAL_TABLET | Freq: Every day | ORAL | Status: DC

## 2015-04-27 NOTE — Telephone Encounter (Signed)
Done-ac 

## 2015-04-27 NOTE — Telephone Encounter (Signed)
Pt called and is on BC pills, the 3 months supply that comes in one packet and she skips the week that her period comes on. And today is the week that her period is suppose to start and she normally starts a new pack on that day, and this is the first 3 months that she has done it since she came off the depo, but she needs a refill hopefully today so she can start it today like her and MNS have talked about, and the pill is working good for her, so can we send in her [facility], [facility] [place].

## 2015-04-28 ENCOUNTER — Other Ambulatory Visit: Payer: Self-pay | Admitting: Psychiatry

## 2015-05-11 ENCOUNTER — Ambulatory Visit (INDEPENDENT_AMBULATORY_CARE_PROVIDER_SITE_OTHER): Payer: 59 | Admitting: Licensed Clinical Social Worker

## 2015-05-11 DIAGNOSIS — F411 Generalized anxiety disorder: Secondary | ICD-10-CM | POA: Diagnosis not present

## 2015-05-14 ENCOUNTER — Ambulatory Visit: Payer: 59 | Admitting: Psychiatry

## 2015-05-19 ENCOUNTER — Ambulatory Visit (INDEPENDENT_AMBULATORY_CARE_PROVIDER_SITE_OTHER): Payer: 59 | Admitting: Psychiatry

## 2015-05-19 ENCOUNTER — Encounter: Payer: Self-pay | Admitting: Psychiatry

## 2015-05-19 VITALS — BP 122/78 | HR 122 | Temp 98.7°F | Ht 66.0 in | Wt 165.0 lb

## 2015-05-19 DIAGNOSIS — F411 Generalized anxiety disorder: Secondary | ICD-10-CM

## 2015-05-19 MED ORDER — AMPHETAMINE-DEXTROAMPHETAMINE 10 MG PO TABS: 10.0000 mg | ORAL_TABLET | Freq: Two times a day (BID) | ORAL | Status: DC

## 2015-05-19 MED ORDER — ALPRAZOLAM 0.5 MG PO TABS
0.5000 mg | ORAL_TABLET | Freq: Every evening | ORAL | Status: DC | PRN
Start: 2015-05-19 — End: 2015-07-07

## 2015-05-19 MED ORDER — PAROXETINE HCL 20 MG PO TABS: 20.0000 mg | ORAL_TABLET | Freq: Every day | ORAL | Status: DC

## 2015-05-19 MED ORDER — LAMOTRIGINE 25 MG PO TABS: 25.0000 mg | ORAL_TABLET | Freq: Every day | ORAL | Status: DC

## 2015-05-19 NOTE — Progress Notes (Signed)
Psychiatric MD Follow Up NOTE  Patient Identification: Alyssa Sharp MRN:  [id number] Date of Evaluation:  05/19/2015 Referral Source: [facility]  Chief Complaint:   Chief Complaint    Follow-up; Medication Refill; Anxiety     Visit Diagnosis:    ICD-9-CM ICD-10-CM   1. GAD (generalized anxiety disorder) 300.02 F41.1    Diagnosis:   Patient Active Problem List   Diagnosis Date Noted  . Anxiety [F41.9] 01/07/2015  . Visit for preventive health examination [Z00.00] 12/15/2013  . GAD (generalized anxiety disorder) [F41.1] 11/28/2012  . ADD (attention deficit disorder) [F90.9] 11/28/2012  . Other malaise and fatigue [R53.81, R53.83] 11/28/2012  . Tobacco abuse [Z72.0] 11/28/2012  . Tobacco abuse counseling [Z71.6] 11/28/2012  . Other general counseling and advice for contraceptive management [Z30.09] 11/28/2012   History of Present Illness:   Patient is a 25 year old single white female who presented for follow up.   Patient reported that she is feeling very excited as she is leaving for [place] in the end of March. Patient reported that she is going to move with her friend who will be her roommate. Her best friend has also been accepted to the nursing school in [place]. Patient reported that she has been taking her medication including lamotrigine as prescribed. She has already stopped taking the Paxil approximately 2 months ago. Patient reported that she has gained significant amount of weight since her birth control was changed. She is concerned about the same. She was talking about in detail and was asking about the different options. She reported that she exercises regularly. She currently denied having any adverse effects of her psychotropic medication. She reported that she would like to go higher on her Adderall as she was taking up to 4 pills before she started coming here. She reported that she will go back to her primary care physician again when she will move to  [place]. She reported that she is not having any anxiety symptoms at this time. She appeared calm and collective during the interview. She currently denied having any mood swings anger anxiety or paranoia.  She reported that she has issues with her sexuality and her mother  will go with her when  she is having a Pap smear on a routine basis. She is receptive to her medication changes at this time. She denied having suicidal ideations or plans. She denied having any perceptual disturbances    Past Medical History:  Past Medical History  Diagnosis Date  . Chicken pox   . Anxiety 2013  . ADHD (attention deficit hyperactivity disorder)     Past Surgical History  Procedure Laterality Date  . Laproscopic  2008    exploratory   Family History:  Family History  Problem Relation Age of Onset  . Stroke Maternal Grandfather   . Hypertension Maternal Grandfather   . Alcohol abuse Maternal Grandfather   . Anxiety disorder Mother   . Anxiety disorder Sister   . Depression Sister   . ADD / ADHD Sister    Social History:   Social History   Social History  . Marital Status: Single    Spouse Name: N/A  . Number of Children: N/A  . Years of Education: N/A   Social History Main Topics  . Smoking status: Current Every Day Smoker -- 0.50 packs/day    Types: Cigarettes    Start date: 02/16/2012  . Smokeless tobacco: Never Used  . Alcohol Use: 1.2 - 7.2 oz/week    0 Standard drinks  or equivalent, 2-8 Glasses of wine, 0-4 Cans of beer, 0 Shots of liquor per week  . Drug Use: No  . Sexual Activity: Not Currently    Birth Control/ Protection: Injection   Other Topics Concern  . None   Social History Narrative   Additional Social History:   Moving to [facility] next month. Starting nursing school.    Musculoskeletal: Strength & Muscle Tone: within normal limits Gait & Station: normal Patient leans: N/A  Psychiatric Specialty Exam: Anxiety Patient reports no insomnia, nervous/anxious  behavior or suicidal ideas.      Review of Systems  Psychiatric/Behavioral: Positive for depression. Negative for suicidal ideas, hallucinations and substance abuse. The patient is not nervous/anxious and does not have insomnia.   All other systems reviewed and are negative.   Blood pressure 122/78, pulse 122, temperature 98.7 F (37.1 C), temperature source Tympanic, height  (1.676 m), weight 165 lb (74.844 kg), SpO2 98 %.Body mass index is 26.64 kg/(m^2).  General Appearance: Casual  Eye Contact:  Fair  Speech:  Clear and Coherent  Volume:  Normal  Mood:  Euthymic  Affect:  Appropriate  Thought Process:  Coherent and Goal Directed  Orientation:  Full (Time, Place, and Person)  Thought Content:  WDL  Suicidal Thoughts:  No  Homicidal Thoughts:  No  Memory:  Immediate;   Fair  Judgement:  Fair  Insight:  Fair  Psychomotor Activity:  Normal  Concentration:  Fair  Recall:  [employer] of Knowledge:Fair  Language: Fair  Akathisia:  No  Handed:  Right  AIMS (if indicated):    Assets:  Communication Skills Desire for Improvement Physical Health Social Support Talents/Skills  ADL's:  Intact  Cognition: WNL  Sleep:     Is the patient at risk to self?  No. Has the patient been a risk to self in the past 6 months?  No. Has the patient been a risk to self within the distant past?  No. Is the patient a risk to others?  No. Has the patient been a risk to others in the past 6 months?  No. Has the patient been a risk to others within the distant past?  No.  Allergies:  No Known Allergies Current Medications: Current Outpatient Prescriptions  Medication Sig Dispense Refill  . ALPRAZolam (XANAX) 0.5 MG tablet Take 1 tablet (0.5 mg total) by mouth daily as needed. 30 tablet 2  . amphetamine-dextroamphetamine (ADDERALL) 10 MG tablet Take 1 tablet (10 mg total) by mouth 2 (two) times daily. 60 tablet 0  . estradiol (ESTRACE) 1 MG tablet Take by mouth.    . lamoTRIgine  (LAMICTAL) 25 MG tablet TAKE 1 TABLET (25 MG TOTAL) BY MOUTH DAILY. 30 tablet 1  . Levonorgestrel-Ethinyl Estradiol (AMETHIA,CAMRESE) 0.1-0.02 & 0.01 MG tablet Take 1 tablet by mouth daily. 1 Package 4  . PARoxetine (PAXIL) 20 MG tablet Take 1 tablet (20 mg total) by mouth daily. 90 tablet 3   No current facility-administered medications for this visit.    Previous Psychotropic Medications:  Lexapro, Zoloft, Buspar, Prozac.   Substance Abuse History in the last 12 months:  Yes.    Red wine- 2 glasses 3-4 nights per week Smokes 5-6 cig per day  Consequences of Substance Abuse: Negative NA Blackouts:   Raped   Medical Decision Making:  Review of Psycho-Social Stressors (1)  Treatment Plan Summary: Medication management   Discussed with patient about her medications at length.  She reported that she will start taking  the Paxil again to help with her anxiety. She will also continue on Adderall 10 mg BID  for ADD symptoms.She was given 2 month supply of the medications  She will continue on Xanax half a pill on when necessary basis. Continue  lamotrigine 25 mg daily for mood stabilization.  Discussed with her at length about the adverse effects of the medication including the risk of rash  and she demonstrated understanding.  Patient is relocating to [place] at this time and will follow up over there.  More than 50% of the time spent in psychoeducation, counseling and coordination of care.  Time spent with the patient 25 minutes   This note was generated in part or whole with voice recognition software. Voice regonition is usually quite accurate but there are transcription errors that can and very often do occur. I apologize for any typographical errors that were not detected and corrected.     Brandy Hale, MD  2/28/201711:02 AM

## 2015-05-19 NOTE — Progress Notes (Signed)
   THERAPIST PROGRESS NOTE  Session Time: 8  Participation Level: Active  Behavioral Response: CasualAlertAnxious  Type of Therapy: Individual Therapy  Treatment Goals addressed: Coping  Interventions: CBT, Motivational Interviewing, Solution Focused, Strength-based, Supportive, Family Systems and Reframing  Summary: Alyssa Sharp is a 25 y.o. female who presents with continued symptoms of her diagnosis.  Reviewed current stressors and how she is currently managing them.  Discussion of her attending College in the Spring in [place].  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     Suicidal/Homicidal: Nowithout intent/plan  Therapist Response: LCSW provided Patient with ongoing emotional support and encouragement.  Normalized her feelings.  Commended Patient on her progress and reinforced the importance of client staying focused on her own strengths and resources and resiliency. Processed various strategies for dealing with stressors.    Plan:Discussion of therapist on Maternity leave soon.  Provided her with a list of options.  Diagnosis: Axis I: Generalized Anxiety Disorder    Axis II: No diagnosis    Marinda Elk 05/11/2015

## 2015-06-09 ENCOUNTER — Ambulatory Visit: Payer: Self-pay | Admitting: Obstetrics and Gynecology

## 2015-06-11 ENCOUNTER — Ambulatory Visit (INDEPENDENT_AMBULATORY_CARE_PROVIDER_SITE_OTHER): Payer: 59 | Admitting: Obstetrics and Gynecology

## 2015-06-11 ENCOUNTER — Encounter: Payer: Self-pay | Admitting: Obstetrics and Gynecology

## 2015-06-11 VITALS — BP 127/83 | HR 120 | Ht 65.0 in | Wt 164.2 lb

## 2015-06-11 DIAGNOSIS — Z309 Encounter for contraceptive management, unspecified: Secondary | ICD-10-CM | POA: Diagnosis not present

## 2015-06-11 DIAGNOSIS — N644 Mastodynia: Secondary | ICD-10-CM

## 2015-06-11 DIAGNOSIS — Z3009 Encounter for other general counseling and advice on contraception: Secondary | ICD-10-CM

## 2015-06-11 NOTE — Progress Notes (Signed)
Subjective:     Patient ID: Alyssa Sharp, female   DOB: [birth year], 25 y.o.   MRN: [id number]  HPI Had been on [facility] and dys[facility], changed to Lo Seasonique  4 months ago, and was doing fine the first three months, but then developed severe breast tenderness and mood swings, so she stopped the pills 3 weeks ago, and feels much better, breast tenderness resolved, but no menses yet.  Review of Systems See above    Objective:   Physical Exam A&O x4  well groomed female in no distress Blood pressure 127/83, pulse 120, height 5\' 5"  (1.651 m), weight 164 lb 3.2 oz (74.481 kg). Breasts: breasts appear normal, no suspicious masses, no skin or nipple changes or axillary nodes. Pelvic exam: normal external genitalia, vulva, vagina, cervix, uterus and adnexa.    Assessment:     Breast tenderness secondary to OCP Contraception counseling Anxiety      Plan:     Desires Kyleena IUD rx for cytotec 100mcg to place vaginally night before insertion. RTC at start of next menses, and on 4/5 if not started by then.  Melody [place], CNM

## 2015-06-17 ENCOUNTER — Ambulatory Visit: Payer: Self-pay | Admitting: Obstetrics and Gynecology

## 2015-07-07 ENCOUNTER — Ambulatory Visit (INDEPENDENT_AMBULATORY_CARE_PROVIDER_SITE_OTHER): Payer: 59 | Admitting: Obstetrics and Gynecology

## 2015-07-07 VITALS — BP 109/59 | HR 79 | Wt 165.6 lb

## 2015-07-07 DIAGNOSIS — Z30430 Encounter for insertion of intrauterine contraceptive device: Secondary | ICD-10-CM

## 2015-07-07 MED ORDER — ALPRAZOLAM 0.5 MG PO TABS: 0.5000 mg | ORAL_TABLET | Freq: Two times a day (BID) | ORAL | Status: DC | PRN

## 2015-07-07 MED ORDER — PAROXETINE HCL 20 MG PO TABS: 20.0000 mg | ORAL_TABLET | Freq: Every day | ORAL | Status: DC

## 2015-07-07 MED ORDER — LAMOTRIGINE 25 MG PO TABS: 25.0000 mg | ORAL_TABLET | Freq: Every day | ORAL | Status: DC

## 2015-07-07 MED ORDER — AMPHETAMINE-DEXTROAMPHETAMINE 20 MG PO TABS: 10.0000 mg | ORAL_TABLET | Freq: Two times a day (BID) | ORAL | Status: DC

## 2015-07-07 MED ORDER — LEVONORGESTREL 19.5 MG IU IUD: 1.0000 | INTRAUTERINE_SYSTEM | Freq: Once | INTRAUTERINE | Status: AC

## 2015-07-07 NOTE — Progress Notes (Signed)
Alyssa Sharp is a 25 y.o. year old 600P0000 Caucasian female who presents for placement of a Kyleena IUD.  Patient's last menstrual period was 07/02/2015. BP 109/59 mmHg  Pulse 79  Wt 165 lb 9.6 oz (75.116 kg)  LMP 07/02/2015 Last sexual intercourse was >4 weeks ago and pregnancy test today was negative  The risks and benefits of the method and placement have been thouroughly reviewed with the patient and all questions were answered.  Specifically the patient is aware of failure rate of 03/998, expulsion of the IUD and of possible perforation.  The patient is aware of irregular bleeding due to the method and understands the incidence of irregular bleeding diminishes with time.  Signed copy of informed consent in chart.   Time out was performed.  A pederson speculum was placed in the vagina.  The cervix was visualized, prepped using Betadine, and grasped with a single tooth tenaculum. The uterus was found to be neutral and it sounded to 7 cm.  Kyleena IUD placed per manufacturer's recommendations.   The strings were trimmed to 3 cm.  The patient was given post procedure instructions, including signs and symptoms of infection and to check for the strings after each menses or each month, and refraining from intercourse or anything in the vagina for 3 days.  She was given a [place] care card with date [place] placed, and date Rutha BouchardKyleena to be removed.  Also need 3 months RX for all meds [place]  Melody Suzan NailerN Shambley, [place]

## 2015-07-14 ENCOUNTER — Ambulatory Visit: Payer: Self-pay | Admitting: Obstetrics and Gynecology

## 2015-10-30 ENCOUNTER — Encounter: Payer: 59 | Admitting: Obstetrics and Gynecology

## 2016-02-10 ENCOUNTER — Ambulatory Visit (INDEPENDENT_AMBULATORY_CARE_PROVIDER_SITE_OTHER): Payer: 59 | Admitting: Obstetrics and Gynecology

## 2016-02-10 ENCOUNTER — Encounter: Payer: Self-pay | Admitting: Obstetrics and Gynecology

## 2016-02-10 VITALS — BP 113/73 | HR 85 | Ht 65.0 in | Wt 157.4 lb

## 2016-02-10 DIAGNOSIS — Z975 Presence of (intrauterine) contraceptive device: Secondary | ICD-10-CM | POA: Diagnosis not present

## 2016-02-10 DIAGNOSIS — N921 Excessive and frequent menstruation with irregular cycle: Secondary | ICD-10-CM

## 2016-02-10 DIAGNOSIS — R5383 Other fatigue: Secondary | ICD-10-CM | POA: Diagnosis not present

## 2016-02-10 DIAGNOSIS — Z30431 Encounter for routine checking of intrauterine contraceptive device: Secondary | ICD-10-CM

## 2016-02-10 MED ORDER — LAMOTRIGINE 25 MG PO TABS
25.0000 mg | ORAL_TABLET | Freq: Every day | ORAL | 2 refills | Status: DC
Start: 2016-02-10 — End: 2016-10-04

## 2016-02-10 MED ORDER — PAROXETINE HCL 20 MG PO TABS: 20.0000 mg | ORAL_TABLET | Freq: Every day | ORAL | 1 refills | Status: DC

## 2016-02-10 MED ORDER — NORGESTIMATE-ETH ESTRADIOL 0.25-35 MG-MCG PO TABS: 1.0000 | ORAL_TABLET | Freq: Every day | ORAL | 11 refills | Status: DC

## 2016-02-10 MED ORDER — ALPRAZOLAM 0.5 MG PO TABS: 0.5000 mg | ORAL_TABLET | Freq: Two times a day (BID) | ORAL | 1 refills | Status: DC | PRN

## 2016-02-10 NOTE — Progress Notes (Signed)
Subjective:     Patient ID: Alyssa Sharp, female   DOB: [birth year], 25 y.o.   MRN: [id number]  HPI Reports irregular brown spotting since IUD placed in April, with moderate cramping at least once a week. Fatigue and generalized not feeling well. Does report just restarted anxiety meds and is feeling a little better. Is sexually active occasionally and denies pain or postcoital bleeding. Happy with IUD just wants to make sure it is OK and have labs checked.  Is home from college [employer]) for break.  Review of Systems Negative except stated in HPI.    Objective:   Physical Exam A&O x4 Well groomed female in no distress Blood pressure 113/73, pulse 85, height '5\' 5"'  (1.651 m), weight 157 lb 6.4 oz (71.4 kg). Thyroid not enlarged HRR Pelvic exam: normal external genitalia, vulva, vagina, cervix, uterus and adnexa, IUD string noted with scant brown d/c.    Assessment:     IUD check Pelvic cramping Fatigue BTB with IUD    Plan:     Labs obtained, reassured of normal findings. meds refilled. Recommended a few months of OCP to regulate spotting-desires and RX sent in.  RTC as needed.  Kishon Garriga [place], CNM

## 2016-02-11 LAB — COMPREHENSIVE METABOLIC PANEL
ALK PHOS: 69 IU/L (ref 39–117)
ALT: 22 IU/L (ref 0–32)
AST: 15 IU/L (ref 0–40)
Albumin/Globulin Ratio: 1.9 (ref 1.2–2.2)
Albumin: 4.7 g/dL (ref 3.5–5.5)
BUN/Creatinine Ratio: 11 (ref 9–23)
BUN: 8 mg/dL (ref 6–20)
Bilirubin Total: 0.3 mg/dL (ref 0.0–1.2)
CALCIUM: 9.6 mg/dL (ref 8.7–10.2)
CO2: 26 mmol/L (ref 18–29)
CREATININE: 0.72 mg/dL (ref 0.57–1.00)
Chloride: 100 mmol/L (ref 96–106)
GFR calc Af Amer: 136 mL/min/{1.73_m2} (ref >59)
GFR, EST NON AFRICAN AMERICAN: 118 mL/min/{1.73_m2} (ref >59)
GLOBULIN, TOTAL: 2.5 g/dL (ref 1.5–4.5)
Glucose: 91 mg/dL (ref 65–99)
POTASSIUM: 4.2 mmol/L (ref 3.5–5.2)
SODIUM: 141 mmol/L (ref 134–144)
Total Protein: 7.2 g/dL (ref 6.0–8.5)

## 2016-02-11 LAB — THYROID PANEL WITH TSH
FREE THYROXINE INDEX: 1.7 (ref 1.2–4.9)
T3 UPTAKE RATIO: 25 % (ref 24–39)
T4 TOTAL: 6.8 ug/dL (ref 4.5–12.0)
TSH: 0.518 u[IU]/mL (ref 0.450–4.500)

## 2016-02-11 LAB — VITAMIN D 25 HYDROXY (VIT D DEFICIENCY, FRACTURES): VIT D 25 HYDROXY: 31.7 ng/mL (ref 30.0–100.0)

## 2016-10-04 ENCOUNTER — Ambulatory Visit (INDEPENDENT_AMBULATORY_CARE_PROVIDER_SITE_OTHER): Payer: 59 | Admitting: Obstetrics and Gynecology

## 2016-10-04 ENCOUNTER — Encounter: Payer: Self-pay | Admitting: Obstetrics and Gynecology

## 2016-10-04 VITALS — BP 125/77 | HR 69 | Ht 67.0 in | Wt 130.7 lb

## 2016-10-04 DIAGNOSIS — R102 Pelvic and perineal pain: Secondary | ICD-10-CM | POA: Diagnosis not present

## 2016-10-04 DIAGNOSIS — Z30431 Encounter for routine checking of intrauterine contraceptive device: Secondary | ICD-10-CM | POA: Diagnosis not present

## 2016-10-04 DIAGNOSIS — R109 Unspecified abdominal pain: Secondary | ICD-10-CM

## 2016-10-04 LAB — POCT URINALYSIS DIPSTICK
Bilirubin, UA: NEGATIVE
GLUCOSE UA: NEGATIVE
Ketones, UA: NEGATIVE
Leukocytes, UA: NEGATIVE
NITRITE UA: NEGATIVE
PROTEIN UA: NEGATIVE
RBC UA: NEGATIVE
SPEC GRAV UA: 1.01 (ref 1.010–1.025)
UROBILINOGEN UA: 0.2 U/dL
pH, UA: 7 (ref 5.0–8.0)

## 2016-10-04 MED ORDER — AMPHETAMINE-DEXTROAMPHETAMINE 20 MG PO TABS: 10.0000 mg | ORAL_TABLET | Freq: Two times a day (BID) | ORAL | 0 refills | Status: DC

## 2016-10-04 MED ORDER — ALPRAZOLAM 0.5 MG PO TABS: 0.5000 mg | ORAL_TABLET | Freq: Two times a day (BID) | ORAL | 1 refills | Status: DC | PRN

## 2016-10-04 NOTE — Progress Notes (Signed)
Subjective:     Patient ID: Kristeen MansKelsei Taite, female   DOB: [birth year], 26 y.o.   MRN: [id number]  HPI Here for IUD check. Hasn't been able to feel string since May. Occasional spotting every few weeks. Increased lower pelvic cramping.  Also concerned about frequent kidney infections since February . Both requiring hospitalization and IV antibiotics(living in [place]) Needs refill on medications for anxiety and ADD.  Review of Systems Lower pelvic cramping and left breast tenderness intermittent. Decreased energy level. Otherwise negative.     Objective:   Physical Exam    A&O x4 Well groomed female in no distress Blood pressure 125/77, pulse 69, height 5\' 7"  (1.702 m), weight 130 lb 11.2 oz (59.3 kg). Abdomen soft and not tender, negative CVA bilaterally. Pelvic exam: normal external genitalia, vulva, vagina, cervix, uterus and adnexa, IUD string noted. Urine dipstick shows negative for all components.   Assessment:     IUD check Pelvic pain H/o pylonephritis x 2    Plan:     Labs obtained, will follow up accordingly. meds refilled for 3 months. RTC as needed. Reassured of normal findings at today's visit. Recommend another renal ultrasound with IVP if she has another kidney infection. Reminded her of good perineal hygiene and post coital voiding.  Obinna Ehresman [place], [place]

## 2016-10-05 LAB — COMPREHENSIVE METABOLIC PANEL
A/G RATIO: 2.2 (ref 1.2–2.2)
ALBUMIN: 5.3 g/dL (ref 3.5–5.5)
ALT: 17 IU/L (ref 0–32)
AST: 18 IU/L (ref 0–40)
Alkaline Phosphatase: 75 IU/L (ref 39–117)
BILIRUBIN TOTAL: 0.6 mg/dL (ref 0.0–1.2)
BUN / CREAT RATIO: 13 (ref 9–23)
BUN: 10 mg/dL (ref 6–20)
CALCIUM: 10.3 mg/dL — AB (ref 8.7–10.2)
CO2: 22 mmol/L (ref 20–29)
Chloride: 100 mmol/L (ref 96–106)
Creatinine, Ser: 0.78 mg/dL (ref 0.57–1.00)
GFR, EST AFRICAN AMERICAN: 122 mL/min/{1.73_m2} (ref >59)
GFR, EST NON AFRICAN AMERICAN: 106 mL/min/{1.73_m2} (ref >59)
GLUCOSE: 94 mg/dL (ref 65–99)
Globulin, Total: 2.4 g/dL (ref 1.5–4.5)
Potassium: 4.4 mmol/L (ref 3.5–5.2)
Sodium: 139 mmol/L (ref 134–144)
TOTAL PROTEIN: 7.7 g/dL (ref 6.0–8.5)

## 2016-10-06 LAB — URINE CULTURE: Organism ID, Bacteria: NO GROWTH

## 2017-02-08 ENCOUNTER — Encounter: Payer: Self-pay | Admitting: Obstetrics and Gynecology

## 2017-02-08 ENCOUNTER — Ambulatory Visit (INDEPENDENT_AMBULATORY_CARE_PROVIDER_SITE_OTHER): Payer: 59 | Admitting: Obstetrics and Gynecology

## 2017-02-08 VITALS — BP 101/60 | HR 107 | Ht 67.0 in | Wt 133.4 lb

## 2017-02-08 DIAGNOSIS — R102 Pelvic and perineal pain: Secondary | ICD-10-CM | POA: Diagnosis not present

## 2017-02-08 NOTE — Progress Notes (Signed)
HPI:      [name] is a 26 y.o. G0P0000 who LMP was No LMP recorded. Patient is not currently having periods (Reason: IUD).  Subjective:   She presents today with complaint of pelvic pain especially with intercourse.  She is concerned that her IUD may be causing the pain despite the fact that she has had it for more than a year and most of that time did not have problems.  Her significant history includes a history of endometriosis.  She continues to have monthly cyclic bleeding with her IUD. Alyssa Sharp(Kyleena)    Hx: The following portions of the patient's history were reviewed and updated as appropriate:             She  has a past medical history of ADHD (attention deficit hyperactivity disorder), Anxiety (2013), and Chicken pox. She does not have any pertinent problems on file. She  has a past surgical history that includes laproscopic (2008). Her family history includes ADD / ADHD in her sister; Alcohol abuse in her maternal grandfather; Anxiety disorder in her mother and sister; Depression in her sister; Hypertension in her maternal grandfather; Stroke in her maternal grandfather. She  reports that she has been smoking cigarettes.  She started smoking about 4 years ago. She has been smoking about 0.50 packs per day. she has never used smokeless tobacco. She reports that she drinks about 1.2 - 7.2 oz of alcohol per week. She reports that she does not use drugs. She has a current medication list which includes the following prescription(s): alprazolam, amphetamine-dextroamphetamine, and levonorgestrel. She has No Known Allergies.       Review of Systems:  Review of Systems  Constitutional: Denied constitutional symptoms, night sweats, recent illness, fatigue, fever, insomnia and weight loss.  Eyes: Denied eye symptoms, eye pain, photophobia, vision change and visual disturbance.  Ears/Nose/Throat/Neck: Denied ear, nose, throat or neck symptoms, hearing loss, nasal discharge, sinus  congestion and sore throat.  Cardiovascular: Denied cardiovascular symptoms, arrhythmia, chest pain/pressure, edema, exercise intolerance, orthopnea and palpitations.  Respiratory: Denied pulmonary symptoms, asthma, pleuritic pain, productive sputum, cough, dyspnea and wheezing.  Gastrointestinal: Denied, gastro-esophageal reflux, melena, nausea and vomiting.  Genitourinary: See HPI for additional information.  Musculoskeletal: Denied musculoskeletal symptoms, stiffness, swelling, muscle weakness and myalgia.  Dermatologic: Denied dermatology symptoms, rash and scar.  Neurologic: Denied neurology symptoms, dizziness, headache, neck pain and syncope.  Psychiatric: Denied psychiatric symptoms, anxiety and depression.  Endocrine: Denied endocrine symptoms including hot flashes and night sweats.   Meds:   Current Outpatient Medications on File Prior to Visit  Medication Sig Dispense Refill  . ALPRAZolam (XANAX) 0.5 MG tablet Take 1 tablet (0.5 mg total) by mouth 2 (two) times daily as needed. 90 tablet 1  . amphetamine-dextroamphetamine (ADDERALL) 20 MG tablet Take 0.5 tablets (10 mg total) by mouth 2 (two) times daily. To be filled 05/20/15 60 tablet 0  . Levonorgestrel (KYLEENA) 19.5 MG IUD 1 Device by Intrauterine route once. 1 Intra Uterine Device 0   No current facility-administered medications on file prior to visit.     Objective:     Vitals:   02/08/17 1536  BP: 101/60  Pulse: (!) 107              Physical examination   Pelvic:   Vulva: Normal appearance.  No lesions.  Vagina: No lesions or abnormalities noted.  Support: Normal pelvic support.  Urethra No masses tenderness or scarring.  Meatus Normal size without lesions or prolapse.  Cervix: Normal appearance.  No lesions. IUD strings noted at cervical os.  Anus: Normal exam.  No lesions.  Perineum: Normal exam.  No lesions.        Bimanual   Uterus: Normal size.  Non-tender.  Mobile.  AV.  Adnexae: No masses.   Non-tender to palpation.  Cul-de-sac: Negative for abnormality.     Assessment:    G0P0000 Patient Active Problem List   Diagnosis Date Noted  . Anxiety 01/07/2015  . Visit for preventive health examination 12/15/2013  . GAD (generalized anxiety disorder) 11/28/2012  . ADD (attention deficit disorder) 11/28/2012  . Other malaise and fatigue 11/28/2012  . Tobacco abuse 11/28/2012  . Tobacco abuse counseling 11/28/2012  . Other general counseling and advice for contraceptive management 11/28/2012     1. Pelvic pain     I doubt this pain is from her IUD.  May be a reactivation of endometriosis.  Possible ovarian cyst associated with IUD.  Bleeding and lack of complete endometriosis suppression is [facility].   Plan:            1.  Pelvic ultrasound for IUD position and possible ovarian cyst.  2.  Consider Mirena instead of Kyleena if no other findings. Orders No orders of the defined types were placed in this encounter.   No orders of the defined types were placed in this encounter.     F/U  Return in about 3 weeks (around 03/01/2017).  Elonda Huskyavid J. Evans, M.D. 02/08/2017 4:52 PM

## 2017-02-21 ENCOUNTER — Other Ambulatory Visit: Payer: Self-pay | Admitting: *Deleted

## 2017-02-21 ENCOUNTER — Ambulatory Visit (INDEPENDENT_AMBULATORY_CARE_PROVIDER_SITE_OTHER): Payer: 59 | Admitting: Obstetrics and Gynecology

## 2017-02-21 ENCOUNTER — Encounter: Payer: Self-pay | Admitting: Obstetrics and Gynecology

## 2017-02-21 ENCOUNTER — Ambulatory Visit (INDEPENDENT_AMBULATORY_CARE_PROVIDER_SITE_OTHER): Payer: 59

## 2017-02-21 VITALS — BP 111/71 | HR 70 | Wt 132.5 lb

## 2017-02-21 DIAGNOSIS — R102 Pelvic and perineal pain: Secondary | ICD-10-CM

## 2017-02-21 MED ORDER — ALPRAZOLAM 0.5 MG PO TABS: 0.5000 mg | ORAL_TABLET | Freq: Two times a day (BID) | ORAL | 1 refills | Status: DC | PRN

## 2017-02-21 MED ORDER — AMPHETAMINE-DEXTROAMPHETAMINE 20 MG PO TABS: 20.0000 mg | ORAL_TABLET | Freq: Two times a day (BID) | ORAL | 0 refills | Status: DC

## 2017-02-21 NOTE — Progress Notes (Signed)
HPI:      [name] is a 26 y.o. G0P0000 who LMP was No LMP recorded. Patient is not currently having periods (Reason: IUD).  Subjective:   She presents today for ultrasound follow-up of IUD and pelvic pain.  Ultrasound appears completely normal.  IUD position correctly.  Patient continues to experience pelvic discomfort the day after intercourse.  She is concerned her endometriosis may be returning.  She is interested in changing her IUD to a "stronger" 1 in hopes that it suppresses pelvic pain. She is stating that if this IUD change fails she would like surgery for possible  endometriosis ablation.    Hx: The following portions of the patient's history were reviewed and updated as appropriate:             She  has a past medical history of ADHD (attention deficit hyperactivity disorder), Anxiety (2013), and Chicken pox. She does not have any pertinent problems on file. She  has a past surgical history that includes laproscopic (2008). Her family history includes ADD / ADHD in her sister; Alcohol abuse in her maternal grandfather; Anxiety disorder in her mother and sister; Depression in her sister; Hypertension in her maternal grandfather; Stroke in her maternal grandfather. She  reports that she has been smoking cigarettes.  She started smoking about 5 years ago. She has been smoking about 0.50 packs per day. she has never used smokeless tobacco. She reports that she drinks about 1.2 - 7.2 oz of alcohol per week. She reports that she does not use drugs. She has No Known Allergies.       Review of Systems:  Review of Systems  Constitutional: Denied constitutional symptoms, night sweats, recent illness, fatigue, fever, insomnia and weight loss.  Eyes: Denied eye symptoms, eye pain, photophobia, vision change and visual disturbance.  Ears/Nose/Throat/Neck: Denied ear, nose, throat or neck symptoms, hearing loss, nasal discharge, sinus congestion and sore throat.  Cardiovascular:  Denied cardiovascular symptoms, arrhythmia, chest pain/pressure, edema, exercise intolerance, orthopnea and palpitations.  Respiratory: Denied pulmonary symptoms, asthma, pleuritic pain, productive sputum, cough, dyspnea and wheezing.  Gastrointestinal: Denied, gastro-esophageal reflux, melena, nausea and vomiting.  Genitourinary: See HPI for additional information.  Musculoskeletal: Denied musculoskeletal symptoms, stiffness, swelling, muscle weakness and myalgia.  Dermatologic: Denied dermatology symptoms, rash and scar.  Neurologic: Denied neurology symptoms, dizziness, headache, neck pain and syncope.  Psychiatric: Denied psychiatric symptoms, anxiety and depression.  Endocrine: Denied endocrine symptoms including hot flashes and night sweats.   Meds:   Current Outpatient Medications on File Prior to Visit  Medication Sig Dispense Refill  . Levonorgestrel (KYLEENA) 19.5 MG IUD 1 Device by Intrauterine route once. 1 Intra Uterine Device 0   No current facility-administered medications on file prior to visit.     Objective:     Vitals:   02/21/17 1646  BP: 111/71  Pulse: 70              Ultrasound results reviewed directly with the patient.  Assessment:    G0P0000 Patient Active Problem List   Diagnosis Date Noted  . Anxiety 01/07/2015  . Visit for preventive health examination 12/15/2013  . GAD (generalized anxiety disorder) 11/28/2012  . ADD (attention deficit disorder) 11/28/2012  . Other malaise and fatigue 11/28/2012  . Tobacco abuse 11/28/2012  . Tobacco abuse counseling 11/28/2012  . Other general counseling and advice for contraceptive management 11/28/2012     1. Pelvic pain in female        Plan:  [facility] Mirena  Patient would like to do this tomorrow. Orders No orders of the defined types were placed in this encounter.   No orders of the defined types were placed in this encounter.     F/U  Return in about 1 day  (around 02/22/2017). I spent 15 minutes with this patient of which greater than 50% was spent discussing endometriosis, IUD change, possible laparoscopic exploration.  All questions regarding endometriosis and IUDs answered.  Ultrasound reviewed with patient.  Elonda Huskyavid J. Gabrille Kilbride, M.D. 02/21/2017 5:09 PM

## 2017-02-22 ENCOUNTER — Ambulatory Visit (INDEPENDENT_AMBULATORY_CARE_PROVIDER_SITE_OTHER): Payer: 59 | Admitting: Obstetrics and Gynecology

## 2017-02-22 ENCOUNTER — Encounter: Payer: Self-pay | Admitting: Obstetrics and Gynecology

## 2017-02-22 VITALS — BP 119/69 | HR 79 | Wt 132.1 lb

## 2017-02-22 DIAGNOSIS — Z30433 Encounter for removal and reinsertion of intrauterine contraceptive device: Secondary | ICD-10-CM

## 2017-02-22 DIAGNOSIS — R102 Pelvic and perineal pain: Secondary | ICD-10-CM

## 2017-02-22 NOTE — Progress Notes (Signed)
HPI:      [name] is a 26 y.o. G0P0000 who LMP was No LMP recorded. Patient is not currently having periods (Reason: IUD).  Subjective:   She presents today for IUD removal and re-insertion.  Because her menses are so painful and she has a history of endometriosis we are hoping that a higher dose IUD (Mirena) may help her with menses and suppression of pelvic endometriosis.    Hx: The following portions of the patient's history were reviewed and updated as appropriate:             She  has a past medical history of ADHD (attention deficit hyperactivity disorder), Anxiety (2013), and Chicken pox. She does not have any pertinent problems on file. She  has a past surgical history that includes laproscopic (2008). Her family history includes ADD / ADHD in her sister; Alcohol abuse in her maternal grandfather; Anxiety disorder in her mother and sister; Depression in her sister; Hypertension in her maternal grandfather; Stroke in her maternal grandfather. She  reports that she has been smoking cigarettes.  She started smoking about 5 years ago. She has been smoking about 0.50 packs per day. she has never used smokeless tobacco. She reports that she drinks about 1.2 - 7.2 oz of alcohol per week. She reports that she does not use drugs. She has No Known Allergies.       Review of Systems:  Review of Systems  Constitutional: Denied constitutional symptoms, night sweats, recent illness, fatigue, fever, insomnia and weight loss.  Eyes: Denied eye symptoms, eye pain, photophobia, vision change and visual disturbance.  Ears/Nose/Throat/Neck: Denied ear, nose, throat or neck symptoms, hearing loss, nasal discharge, sinus congestion and sore throat.  Cardiovascular: Denied cardiovascular symptoms, arrhythmia, chest pain/pressure, edema, exercise intolerance, orthopnea and palpitations.  Respiratory: Denied pulmonary symptoms, asthma, pleuritic pain, productive sputum, cough, dyspnea and  wheezing.  Gastrointestinal: Denied, gastro-esophageal reflux, melena, nausea and vomiting.  Genitourinary: Denied genitourinary symptoms including symptomatic vaginal discharge, pelvic relaxation issues, and urinary complaints.  Musculoskeletal: Denied musculoskeletal symptoms, stiffness, swelling, muscle weakness and myalgia.  Dermatologic: Denied dermatology symptoms, rash and scar.  Neurologic: Denied neurology symptoms, dizziness, headache, neck pain and syncope.  Psychiatric: Denied psychiatric symptoms, anxiety and depression.  Endocrine: Denied endocrine symptoms including hot flashes and night sweats.   Meds:   Current Outpatient Medications on File Prior to Visit  Medication Sig Dispense Refill  . ALPRAZolam (XANAX) 0.5 MG tablet Take 1 tablet (0.5 mg total) by mouth 2 (two) times daily as needed. 90 tablet 1  . amphetamine-dextroamphetamine (ADDERALL) 20 MG tablet Take 1 tablet (20 mg total) by mouth 2 (two) times daily. To be filled 05/20/15 60 tablet 0  . Levonorgestrel (KYLEENA) 19.5 MG IUD 1 Device by Intrauterine route once. 1 Intra Uterine Device 0   No current facility-administered medications on file prior to visit.     Objective:     Vitals:   02/22/17 1506  BP: 119/69  Pulse: 79    Physical examination   Pelvic:   Vulva: Normal appearance.  No lesions.  Vagina: No lesions or abnormalities noted.  Support: Normal pelvic support.  Urethra No masses tenderness or scarring.  Meatus Normal size without lesions or prolapse.  Cervix: Normal appearance.  No lesions.  Anus: Normal exam.  No lesions.  Perineum: Normal exam.  No lesions.        Bimanual   Uterus: Normal size.  Non-tender.  Mobile.  AV.  Adnexae: No  masses.  Non-tender to palpation.  Cul-de-sac: Negative for abnormality.   IUD Procedure Pt has read the booklet and signed the appropriate forms regarding the Mirena IUD.  All of her questions have been answered.   IUD Removal Strings of IUD  identified and grasped.  IUD removed without problem.  Pt tolerated this well.  IUD noted to be intact. The cervix was cleansed with betadine solution.  After sounding the uterus and noting the position, the IUD was placed in the usual manner without problem.  The string was cut to the appropriate length.  The patient tolerated the procedure well.              Assessment:    G0P0000 Patient Active Problem List   Diagnosis Date Noted  . Anxiety 01/07/2015  . Visit for preventive health examination 12/15/2013  . GAD (generalized anxiety disorder) 11/28/2012  . ADD (attention deficit disorder) 11/28/2012  . Other malaise and fatigue 11/28/2012  . Tobacco abuse 11/28/2012  . Tobacco abuse counseling 11/28/2012  . Other general counseling and advice for contraceptive management 11/28/2012     1. Encounter for IUD removal and reinsertion   2. Pelvic pain in female       Plan:             F/U  Return in about 4 weeks (around 03/22/2017).  Elonda Huskyavid J. Evans, M.D. 02/22/2017 3:59 PM

## 2017-08-22 ENCOUNTER — Other Ambulatory Visit: Payer: Self-pay | Admitting: *Deleted

## 2017-08-22 MED ORDER — AMPHETAMINE-DEXTROAMPHETAMINE 20 MG PO TABS: 20.0000 mg | ORAL_TABLET | Freq: Two times a day (BID) | ORAL | 0 refills | Status: DC

## 2018-02-08 ENCOUNTER — Other Ambulatory Visit: Payer: Self-pay | Admitting: *Deleted

## 2018-02-08 MED ORDER — AMPHETAMINE-DEXTROAMPHETAMINE 20 MG PO TABS: 20.0000 mg | ORAL_TABLET | Freq: Two times a day (BID) | ORAL | 0 refills | Status: DC

## 2018-02-08 MED ORDER — ALPRAZOLAM 0.5 MG PO TABS: 0.5000 mg | ORAL_TABLET | Freq: Two times a day (BID) | ORAL | 1 refills | Status: AC | PRN

## 2018-05-29 ENCOUNTER — Other Ambulatory Visit: Payer: Self-pay | Admitting: *Deleted

## 2018-05-29 MED ORDER — AMPHETAMINE-DEXTROAMPHETAMINE 20 MG PO TABS: 20.0000 mg | ORAL_TABLET | Freq: Two times a day (BID) | ORAL | 0 refills | Status: AC

## 2020-10-11 ENCOUNTER — Ambulatory Visit
Admission: EM | Admit: 2020-10-11 | Discharge: 2020-10-11 | Disposition: A | Discharge status: Home / Self Care | Payer: BC Managed Care – PPO | Attending: Physician Assistant | Admitting: Physician Assistant

## 2020-10-11 ENCOUNTER — Other Ambulatory Visit: Payer: Self-pay

## 2020-10-11 ENCOUNTER — Encounter: Payer: Self-pay | Admitting: Emergency Medicine

## 2020-10-11 DIAGNOSIS — M545 Low back pain, unspecified: Secondary | ICD-10-CM | POA: Diagnosis present

## 2020-10-11 LAB — POCT URINALYSIS DIP (DEVICE)
Bilirubin Urine: NEGATIVE
Glucose, UA: NEGATIVE mg/dL
Hgb urine dipstick: NEGATIVE
Ketones, ur: NEGATIVE mg/dL
Leukocytes,Ua: NEGATIVE
Nitrite: NEGATIVE
Protein, ur: NEGATIVE mg/dL
Specific Gravity, Urine: >=1.03 (ref 1.005–1.030)
Urobilinogen, UA: 0.2 mg/dL (ref 0.0–1.0)
pH: 7 (ref 5.0–8.0)

## 2020-10-11 NOTE — ED Triage Notes (Signed)
Patient c/o left sided lower back pain that started this morning.  Patient reports cloudy urine for the past 2-3 days.  Patient denies any pain.

## 2020-10-11 NOTE — ED Provider Notes (Signed)
[facility]    CSN: [id number] Arrival date & time: 10/11/20  0931      History   Chief Complaint Chief Complaint  Patient presents with   Back Pain    Left side    HPI Keayra Graham is a 30 y.o. female presenting for onset of left lower back pain this morning. Denies recent or previous injury.  Patient says the pain is little better this morning.  She noticed cloudy urine yesterday and the day before.  Patient concerned about possible UTI.  States that she has had a kidney infection in the past and wants to make sure she does not have a kidney infection.  She denies any dysuria, urinary frequency or urgency.  No vaginal discharge, odor or itching.  No fever, fatigue, chills, vomiting or diarrhea.  Denies any radiation of the pain.  Has not take anything for pain.  Nothing seems to make the pain worse.  Last menstrual period was couple weeks ago.  She states that she does not get normal periods and she has an IUD.  No other complaints.  HPI  Past Medical History:  Diagnosis Date   ADHD (attention deficit hyperactivity disorder)    Anxiety 2013   Chicken pox     Patient Active Problem List   Diagnosis Date Noted   Anxiety 01/07/2015   Visit for preventive health examination 12/15/2013   GAD (generalized anxiety disorder) 11/28/2012   ADD (attention deficit disorder) 11/28/2012   Other malaise and fatigue 11/28/2012   Tobacco abuse 11/28/2012   Tobacco abuse counseling 11/28/2012   Other general counseling and advice for contraceptive management 11/28/2012    Past Surgical History:  Procedure Laterality Date   laproscopic  2008   exploratory    OB History     Gravida  0   Para  0   Term  0   Preterm  0   AB  0   Living  0      SAB  0   IAB  0   Ectopic  0   Multiple  0   Live Births               Home Medications    Prior to Admission medications   Medication Sig Start Date End Date Taking? Authorizing Provider   amphetamine-dextroamphetamine (ADDERALL) 20 MG tablet Take 1 tablet (20 mg total) by mouth 2 (two) times daily for 30 days. 05/29/18 10/11/20 Yes Shambley, Melody N, CNM  Levonorgestrel (KYLEENA) 19.5 MG IUD 1 Device by Intrauterine route once. 07/07/15  Yes Shambley, Melody N, CNM  ALPRAZolam (XANAX) 0.5 MG tablet Take 1 tablet (0.5 mg total) by mouth 2 (two) times daily as needed. 02/08/18   Purcell Nails, CNM    Family History Family History  Problem Relation Age of Onset   Stroke Maternal Grandfather    Hypertension Maternal Grandfather    Alcohol abuse Maternal Grandfather    Anxiety disorder Mother    Anxiety disorder Sister    Depression Sister    ADD / ADHD Sister     Social History Social History   Tobacco Use   Smoking status: Former    Packs/day: 0.50    Types: Cigarettes    Start date: 02/16/2012   Smokeless tobacco: Never  Vaping Use   Vaping Use: Some days  Substance Use Topics   Alcohol use: Yes    Alcohol/week: 2.0 - 12.0 standard drinks    Types: 2 - 8  Glasses of wine per week   Drug use: No     Allergies   Patient has no known allergies.   Review of Systems Review of Systems  Constitutional:  Negative for fatigue and fever.  Gastrointestinal:  Negative for abdominal pain, nausea and vomiting.  Genitourinary:  Negative for dysuria, frequency, urgency and vaginal discharge.  Musculoskeletal:  Positive for back pain. Negative for gait problem.  Skin:  Negative for rash.  Neurological:  Negative for weakness and numbness.    Physical Exam Triage Vital Signs ED Triage Vitals  Enc Vitals Group     BP 10/11/20 0957 113/66     Pulse Rate 10/11/20 0957 77     Resp 10/11/20 0957 14     Temp 10/11/20 0957 97.9 F (36.6 C)     Temp Source 10/11/20 0957 Oral     SpO2 10/11/20 0957 100 %     Weight 10/11/20 0953 160 lb (72.6 kg)     Height 10/11/20 0953 5\' 5"  (1.651 m)     Head Circumference --      Peak Flow --      Pain Score 10/11/20 0953 5      Pain Loc --      Pain Edu? --      Excl. in GC? --    No data found.  Updated Vital Signs BP 113/66 (BP Location: Left Arm)   Pulse 77   Temp 97.9 F (36.6 C) (Oral)   Resp 14   Ht 5\' 5"  (1.651 m)   Wt 160 lb (72.6 kg)   SpO2 100%   BMI 26.63 kg/m      Physical Exam Vitals and nursing note reviewed.  Constitutional:      General: She is not in acute distress.    Appearance: Normal appearance. She is not ill-appearing or toxic-appearing.  HENT:     Head: Normocephalic and atraumatic.  Eyes:     General: No scleral icterus.       Right eye: No discharge.        Left eye: No discharge.     Conjunctiva/sclera: Conjunctivae normal.  Cardiovascular:     Rate and Rhythm: Normal rate and regular rhythm.     Heart sounds: Normal heart sounds.  Pulmonary:     Effort: Pulmonary effort is normal. No respiratory distress.     Breath sounds: Normal breath sounds.  Abdominal:     Palpations: Abdomen is soft.     Tenderness: There is no abdominal tenderness. There is no right CVA tenderness or left CVA tenderness.  Musculoskeletal:     Cervical back: Neck supple.     Lumbar back: Tenderness (TTP left lumbar paravertebral muscles) present. No bony tenderness. Decreased range of motion.       Back:  Skin:    General: Skin is dry.  Neurological:     General: No focal deficit present.     Mental Status: She is alert. Mental status is at baseline.     Motor: No weakness.     Gait: Gait normal.  Psychiatric:        Mood and Affect: Mood normal.        Behavior: Behavior normal.        Thought Content: Thought content normal.     UC Treatments / Results  Labs (all labs ordered are listed, but only abnormal results are displayed) Labs Reviewed  URINE CULTURE  POCT URINALYSIS DIPSTICK, ED / UC  POCT URINALYSIS DIP (  DEVICE)    EKG   Radiology No results found.  Procedures Procedures (including critical care time)  Medications Ordered in UC Medications - No  data to display  Initial Impression / Assessment and Plan / UC Course  I have reviewed the triage vital signs and the nursing notes.  Pertinent labs & imaging results that were available during my care of the patient were reviewed by me and considered in my medical decision making (see chart for details).  30 year old female presenting for left lower back pain since earlier today.  Also admits to cloudy urine.  Concern for UTI.  No urinary symptoms.  Vital signs are all normal and stable and she is overall well-appearing.  Exam significant for tenderness of the left lower lumbar region.  No CVA tenderness.  Urinalysis performed today which shows specific gravity of 1.030, otherwise normal.  Offered to send urine for culture to ensure no bacterial growth but patient declines.  Satisfied that the urinalysis is normal.  Advised her that if she has urinary symptoms she should be seen again but her back pain likely due to musculoskeletal cause.  Encourage supportive care with stretches, heat, ibuprofen and Tylenol.  Follow-up as needed.  ED precautions for back pain reviewed in handout.  Final Clinical Impressions(s) / UC Diagnoses   Final diagnoses:  Acute left-sided low back pain without sciatica     Discharge Instructions      The urine test was normal other than the fact that the urine is a little concentrated so you need to increase your fluid intake.  Your back pain is likely musculoskeletal.  Should get better in a few days.  Try some Tylenol and Motrin and heating pad.  BACK PAIN: Stressed avoiding painful activities . RICE (REST, ICE, COMPRESSION, ELEVATION) guidelines reviewed. May alternate ice and heat. Consider use of muscle rubs, Salonpas patches, etc. Use medications as directed including muscle relaxers if prescribed. Take anti-inflammatory medications as prescribed or OTC NSAIDs/Tylenol.  F/u with PCP in 7-10 days for reexamination, and please feel free to call or return to the  urgent care at any time for any questions or concerns you may have and we will be happy to help you!   BACK PAIN RED FLAGS: If the back pain acutely worsens or there are any red flag symptoms such as numbness/tingling, leg weakness, saddle anesthesia, or loss of bowel/bladder control, go immediately to the ER. Follow up with [place] as scheduled or sooner if the pain does not begin to resolve or if it worsens before the follow up       ED Prescriptions   None    PDMP not reviewed this encounter.   Shirlee Latch, PA-C 10/11/20 1031

## 2020-10-11 NOTE — Discharge Instructions (Addendum)
The urine test was normal other than the fact that the urine is a little concentrated so you need to increase your fluid intake.  Your back pain is likely musculoskeletal.  Should get better in a few days.  Try some Tylenol and Motrin and heating pad.  BACK PAIN: Stressed avoiding painful activities . RICE (REST, ICE, COMPRESSION, ELEVATION) guidelines reviewed. May alternate ice and heat. Consider use of muscle rubs, Salonpas patches, etc. Use medications as directed including muscle relaxers if prescribed. Take anti-inflammatory medications as prescribed or OTC NSAIDs/Tylenol.  F/u with PCP in 7-10 days for reexamination, and please feel free to call or return to the urgent care at any time for any questions or concerns you may have and we will be happy to help you!   BACK PAIN RED FLAGS: If the back pain acutely worsens or there are any red flag symptoms such as numbness/tingling, leg weakness, saddle anesthesia, or loss of bowel/bladder control, go immediately to the ER. Follow up with [place] as scheduled or sooner if the pain does not begin to resolve or if it worsens before the follow up

## 2020-10-12 LAB — URINE CULTURE: Culture: NO GROWTH
